# Patient Record
Sex: Female | Born: 1980 | Race: White | Hispanic: No | Smoking: Current some day smoker
Health system: Southern US, Community
[De-identification: ages and names within clinical notes are randomized; demographics above are authoritative.]

## PROBLEM LIST (undated history)

## (undated) DIAGNOSIS — O139 Gestational [pregnancy-induced] hypertension without significant proteinuria, unspecified trimester: Secondary | ICD-10-CM

## (undated) DIAGNOSIS — O24419 Gestational diabetes mellitus in pregnancy, unspecified control: Secondary | ICD-10-CM

## (undated) HISTORY — DX: Gestational diabetes mellitus in pregnancy, unspecified control: O24.419

## (undated) HISTORY — DX: Gestational (pregnancy-induced) hypertension without significant proteinuria, unspecified trimester: O13.9

---
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
## 2014-08-11 LAB — OB RESULTS CONSOLE RUBELLA ANTIBODY, IGM: Rubella: IMMUNE

## 2014-08-11 LAB — OB RESULTS CONSOLE ABO/RH: RH TYPE: POSITIVE

## 2014-08-11 LAB — OB RESULTS CONSOLE HEPATITIS B SURFACE ANTIGEN: Hepatitis B Surface Ag: NEGATIVE

## 2014-08-11 LAB — OB RESULTS CONSOLE ANTIBODY SCREEN: ANTIBODY SCREEN: NEGATIVE

## 2014-08-25 ENCOUNTER — Encounter: Payer: Self-pay | Admitting: Obstetrics and Gynecology

## 2014-08-25 ENCOUNTER — Ambulatory Visit (INDEPENDENT_AMBULATORY_CARE_PROVIDER_SITE_OTHER): Payer: PRIVATE HEALTH INSURANCE | Admitting: Obstetrics and Gynecology

## 2014-08-25 VITALS — BP 122/76 | HR 81 | Ht 66.0 in | Wt 325.6 lb

## 2014-08-25 DIAGNOSIS — Z3491 Encounter for supervision of normal pregnancy, unspecified, first trimester: Secondary | ICD-10-CM

## 2014-08-25 DIAGNOSIS — O99211 Obesity complicating pregnancy, first trimester: Secondary | ICD-10-CM

## 2014-08-25 DIAGNOSIS — O0990 Supervision of high risk pregnancy, unspecified, unspecified trimester: Secondary | ICD-10-CM | POA: Insufficient documentation

## 2014-08-25 DIAGNOSIS — O99210 Obesity complicating pregnancy, unspecified trimester: Secondary | ICD-10-CM

## 2014-08-25 DIAGNOSIS — Z3480 Encounter for supervision of other normal pregnancy, unspecified trimester: Secondary | ICD-10-CM

## 2014-08-25 DIAGNOSIS — O09299 Supervision of pregnancy with other poor reproductive or obstetric history, unspecified trimester: Secondary | ICD-10-CM

## 2014-08-25 DIAGNOSIS — E669 Obesity, unspecified: Secondary | ICD-10-CM

## 2014-08-25 DIAGNOSIS — Z6841 Body Mass Index (BMI) 40.0 and over, adult: Secondary | ICD-10-CM

## 2014-08-25 DIAGNOSIS — Z8632 Personal history of gestational diabetes: Secondary | ICD-10-CM

## 2014-08-25 DIAGNOSIS — Z8742 Personal history of other diseases of the female genital tract: Secondary | ICD-10-CM

## 2014-08-25 DIAGNOSIS — Z8759 Personal history of other complications of pregnancy, childbirth and the puerperium: Secondary | ICD-10-CM

## 2014-08-25 DIAGNOSIS — O09291 Supervision of pregnancy with other poor reproductive or obstetric history, first trimester: Secondary | ICD-10-CM

## 2014-08-25 NOTE — Progress Notes (Signed)
Had some dark brown blood at 7 weeks.  She did have pap smear done 3 weeks ago along with initial lab work.  She was dismissed from this practice due to her weight even though she had delivered twice before with them.

## 2014-08-26 DIAGNOSIS — Z6841 Body Mass Index (BMI) 40.0 and over, adult: Secondary | ICD-10-CM

## 2014-08-26 DIAGNOSIS — Z8759 Personal history of other complications of pregnancy, childbirth and the puerperium: Secondary | ICD-10-CM | POA: Insufficient documentation

## 2014-08-26 DIAGNOSIS — O99210 Obesity complicating pregnancy, unspecified trimester: Secondary | ICD-10-CM

## 2014-08-26 DIAGNOSIS — O09299 Supervision of pregnancy with other poor reproductive or obstetric history, unspecified trimester: Secondary | ICD-10-CM | POA: Insufficient documentation

## 2014-08-26 DIAGNOSIS — Z8632 Personal history of gestational diabetes: Secondary | ICD-10-CM

## 2014-08-26 MED ORDER — DOXYLAMINE-PYRIDOXINE 10-10 MG PO TBEC: DELAYED_RELEASE_TABLET | ORAL | Status: DC

## 2014-08-26 NOTE — Addendum Note (Signed)
Addended by: Catalina Antigua on: 08/26/2014 10:29 AM   Modules accepted: Orders

## 2014-08-26 NOTE — Progress Notes (Signed)
   Subjective:    Dawn Fitzpatrick is a [id number] [redacted]w[redacted]d being seen today for her first obstetrical visit.  Her obstetrical history is significant for obesity, pregnancy induced hypertension and gestational diabetes. Patient does intend to breast feed. Pregnancy history fully reviewed.  Patient reports nausea and vomiting.  Filed Vitals:   08/25/14 1042 08/25/14 1043  BP: 122/76   Pulse: 81   Height:   (1.676 m)  Weight: 325 lb 9.6 oz (147.691 kg)     HISTORY: OB History  Gravida Para Term Preterm AB SAB TAB Ectopic Multiple Living  0 0 0 0 0 0 2    # Outcome Date GA Lbr Len/2nd Weight Sex Delivery Anes PTL Lv  3 CUR           2 TRM 11/24/12 [redacted]w[redacted]d 05:00 10 lb 7 oz (4.734 kg) M SVD None N Y  1 TRM 07/14/10 [redacted]w[redacted]d 30:00 8 lb 4 oz (3.742 kg) F SVD EPI N Y     Past Medical History  Diagnosis Date  . Gestational diabetes     Diet controlled/  . PIH (pregnancy induced hypertension)    Past Surgical History  Procedure Laterality Date  . Tonsillectomy and adenoidectomy  1991   Family History  Problem Relation Age of Onset  . Heart disease Father   . Diabetes Paternal Uncle   . Heart disease Maternal Grandmother   . Stroke Maternal Grandmother   . Heart disease Maternal Grandfather   . Diabetes Paternal Grandmother   . Diabetes Paternal Uncle   . Diabetes Paternal Uncle   . Diabetes Paternal Uncle      Exam    Uterus:     Pelvic Exam: Not performed                              System:     Skin: normal coloration and turgor, no rashes    Neurologic: oriented, no focal deficits   Extremities: normal strength, tone, and muscle mass   HEENT extra ocular movement intact   Mouth/Teeth mucous membranes moist, pharynx normal without lesions and dental hygiene good   Neck supple and no masses   Cardiovascular: regular rate and rhythm   Respiratory:  chest clear, no wheezing, crepitations, rhonchi, normal symmetric air entry   Abdomen: soft, non tender, non  distended, obese   Urinary:       Assessment:    Pregnancy: [id number] Patient Active Problem List   Diagnosis Date Noted  . Morbid obesity with BMI of 50.0-59.9, adult 08/26/2014  . Maternal morbid obesity, antepartum 08/26/2014  . History of pregnancy induced hypertension 08/26/2014  . History of gestational diabetes in prior pregnancy, currently pregnant 08/26/2014  . Supervision of normal pregnancy 08/25/2014        Plan:     Initial labs drawn. Prenatal vitamins. Problem list reviewed and updated. Genetic Screening discussed First Screen: declined. Patient may consider quad screen if abnormal anatomy ultrasound  Ultrasound discussed; fetal survey: requested. Patient declined early GDM screen- She states that she is following a diabetic diet and is checking her CBGs. Patient will bring log at next visit.  Follow up in 4 weeks. 50% of 30 min visit spent on counseling and coordination of care.     Shivaun Bilello 08/26/2014

## 2014-09-24 ENCOUNTER — Ambulatory Visit (INDEPENDENT_AMBULATORY_CARE_PROVIDER_SITE_OTHER): Payer: PRIVATE HEALTH INSURANCE | Admitting: Obstetrics & Gynecology

## 2014-09-24 ENCOUNTER — Encounter: Payer: Self-pay | Admitting: Obstetrics & Gynecology

## 2014-09-24 VITALS — BP 125/75 | HR 77 | Wt 323.8 lb

## 2014-09-24 DIAGNOSIS — Z3492 Encounter for supervision of normal pregnancy, unspecified, second trimester: Secondary | ICD-10-CM

## 2014-09-24 NOTE — Progress Notes (Signed)
FHR present on U/S.

## 2014-09-24 NOTE — Progress Notes (Signed)
Routine visit. Anatomy u/s ordered for 19 weeks. Check hba1c. She reports that her fbs are 90s and 2 hours are less than 120. She will get her flu vaccine at her [place]

## 2014-09-25 LAB — HEMOGLOBIN A1C
ESTIMATED AVERAGE GLUCOSE: 126 mg/dL
Hgb A1c MFr Bld: 6 % — ABNORMAL HIGH (ref 4.8–5.6)

## 2014-10-21 ENCOUNTER — Encounter: Payer: Self-pay | Admitting: Obstetrics and Gynecology

## 2014-10-21 ENCOUNTER — Ambulatory Visit (INDEPENDENT_AMBULATORY_CARE_PROVIDER_SITE_OTHER): Payer: PRIVATE HEALTH INSURANCE | Admitting: Obstetrics and Gynecology

## 2014-10-21 VITALS — BP 155/80 | HR 88 | Wt 327.0 lb

## 2014-10-21 DIAGNOSIS — O09292 Supervision of pregnancy with other poor reproductive or obstetric history, second trimester: Secondary | ICD-10-CM

## 2014-10-21 DIAGNOSIS — Z8759 Personal history of other complications of pregnancy, childbirth and the puerperium: Secondary | ICD-10-CM

## 2014-10-21 DIAGNOSIS — Z3492 Encounter for supervision of normal pregnancy, unspecified, second trimester: Secondary | ICD-10-CM

## 2014-10-21 DIAGNOSIS — Z8632 Personal history of gestational diabetes: Secondary | ICD-10-CM

## 2014-10-21 DIAGNOSIS — O99212 Obesity complicating pregnancy, second trimester: Secondary | ICD-10-CM

## 2014-10-21 DIAGNOSIS — Z6841 Body Mass Index (BMI) 40.0 and over, adult: Secondary | ICD-10-CM

## 2014-10-21 NOTE — Progress Notes (Signed)
Patient is doing well without complaints. Anatomy ultrasound scheduled for 11/3. Patient did not bring log book or meter but reports fasting CBGs less than 100 mainly in 80's and pp in the 90's. Repeat BP with appropriate cuff was 111/72. Continue monitoring closely

## 2014-10-26 ENCOUNTER — Encounter: Payer: Self-pay | Admitting: Obstetrics and Gynecology

## 2014-10-26 ENCOUNTER — Ambulatory Visit (HOSPITAL_COMMUNITY)
Admission: RE | Admit: 2014-10-26 | Discharge: 2014-10-26 | Disposition: A | Discharge status: Home / Self Care | Payer: PRIVATE HEALTH INSURANCE | Source: Ambulatory Visit | Attending: Obstetrics & Gynecology | Admitting: Obstetrics & Gynecology

## 2014-10-26 ENCOUNTER — Other Ambulatory Visit (HOSPITAL_COMMUNITY): Payer: Self-pay | Admitting: Maternal and Fetal Medicine

## 2014-10-26 DIAGNOSIS — O99212 Obesity complicating pregnancy, second trimester: Secondary | ICD-10-CM | POA: Insufficient documentation

## 2014-10-26 DIAGNOSIS — Z6841 Body Mass Index (BMI) 40.0 and over, adult: Secondary | ICD-10-CM | POA: Diagnosis not present

## 2014-10-26 DIAGNOSIS — Z3A18 18 weeks gestation of pregnancy: Secondary | ICD-10-CM | POA: Insufficient documentation

## 2014-10-26 DIAGNOSIS — O09292 Supervision of pregnancy with other poor reproductive or obstetric history, second trimester: Secondary | ICD-10-CM | POA: Insufficient documentation

## 2014-10-26 DIAGNOSIS — Z1389 Encounter for screening for other disorder: Secondary | ICD-10-CM | POA: Insufficient documentation

## 2014-10-26 DIAGNOSIS — Z3492 Encounter for supervision of normal pregnancy, unspecified, second trimester: Secondary | ICD-10-CM

## 2014-10-26 DIAGNOSIS — O99210 Obesity complicating pregnancy, unspecified trimester: Secondary | ICD-10-CM | POA: Insufficient documentation

## 2014-11-17 ENCOUNTER — Encounter: Payer: Self-pay | Admitting: Family Medicine

## 2014-11-17 ENCOUNTER — Ambulatory Visit (INDEPENDENT_AMBULATORY_CARE_PROVIDER_SITE_OTHER): Payer: PRIVATE HEALTH INSURANCE | Admitting: Family Medicine

## 2014-11-17 VITALS — BP 127/80 | HR 93 | Wt 326.0 lb

## 2014-11-17 DIAGNOSIS — Z3492 Encounter for supervision of normal pregnancy, unspecified, second trimester: Secondary | ICD-10-CM

## 2014-11-17 NOTE — Progress Notes (Signed)
No meter or log book. Blood sugars reported within limits.  BP normal.  Discussed US - pt declined f/u US.

## 2014-12-14 ENCOUNTER — Encounter: Payer: Self-pay | Admitting: Family Medicine

## 2014-12-14 ENCOUNTER — Ambulatory Visit (INDEPENDENT_AMBULATORY_CARE_PROVIDER_SITE_OTHER): Payer: PRIVATE HEALTH INSURANCE | Admitting: Family Medicine

## 2014-12-14 VITALS — BP 137/83 | HR 89 | Wt 329.0 lb

## 2014-12-14 DIAGNOSIS — Z8759 Personal history of other complications of pregnancy, childbirth and the puerperium: Secondary | ICD-10-CM

## 2014-12-14 DIAGNOSIS — Z3492 Encounter for supervision of normal pregnancy, unspecified, second trimester: Secondary | ICD-10-CM

## 2014-12-14 NOTE — Progress Notes (Signed)
Reports BS are 140 at 1 hour. 2 hour pp are at 120 or less. Has h/o GDM and shoulder dystocia of 10 lb 7 oz baby, no sequelae To bring BS log. Needs f/u for anatomy and growth--scheduled in 2 wks.

## 2014-12-14 NOTE — Patient Instructions (Addendum)
Following an appropriate diet and keeping your blood sugar under control is the most important thing to do for your health and that of your unborn baby.  Please check your blood sugar 4 times daily.  Please keep accurate BS logs and bring them with you to every visit.  Please bring your meter also.  Goals for Blood sugar should be: 1. Fasting (first thing in the morning before eating) should be less than 90.   2.  2 hours after meals should be less than 120.  Please eat 3 meals and 3 snacks.  Include protein (meat, dairy-cheese, eggs, nuts) with all meals.  Be mindful that carbohydrates increase your blood sugar.  Not just sweet food (cookies, cake, donuts, fruit, juice, soda) but also bread, pasta, rice, and potatoes.  You have to limit how many carbs you are eating.  Adding exercise, as little as 30 minutes a day can decrease your blood sugar.  Gestational Diabetes Mellitus Gestational diabetes mellitus, often simply referred to as gestational diabetes, is a type of diabetes that some women develop during pregnancy. In gestational diabetes, the pancreas does not make enough insulin (a hormone), the cells are less responsive to the insulin that is made (insulin resistance), or both.Normally, insulin moves sugars from food into the tissue cells. The tissue cells use the sugars for energy. The lack of insulin or the lack of normal response to insulin causes excess sugars to build up in the blood instead of going into the tissue cells. As a result, high blood sugar (hyperglycemia) develops. The effect of high sugar (glucose) levels can cause many problems.  RISK FACTORS You have an increased chance of developing gestational diabetes if you have a family history of diabetes and also have one or more of the following risk factors:  A body mass index over 30 (obesity).  A previous pregnancy with gestational diabetes.  An older age at the time of pregnancy. If blood glucose levels are kept in  the normal range during pregnancy, women can have a healthy pregnancy. If your blood glucose levels are not well controlled, there may be risks to you, your unborn baby (fetus), your labor and delivery, or your newborn baby.  SYMPTOMS  If symptoms are experienced, they are much like symptoms you would normally expect during pregnancy. The symptoms of gestational diabetes include:   Increased thirst (polydipsia).  Increased urination (polyuria).  Increased urination during the night (nocturia).  Weight loss. This weight loss may be rapid.  Frequent, recurring infections.  Tiredness (fatigue).  Weakness.  Vision changes, such as blurred vision.  Fruity smell to your breath.  Abdominal pain. DIAGNOSIS Diabetes is diagnosed when blood glucose levels are increased. Your blood glucose level may be checked by one or more of the following blood tests:  A fasting blood glucose test. You will not be allowed to eat for at least 8 hours before a blood sample is taken.  A random blood glucose test. Your blood glucose is checked at any time of the day regardless of when you ate.  A hemoglobin A1c blood glucose test. A hemoglobin A1c test provides information about blood glucose control over the previous 3 months.  An oral glucose tolerance test (OGTT). Your blood glucose is measured after you have not eaten (fasted) for 1-3 hours and then after you drink a glucose-containing beverage. Since the hormones that cause insulin resistance are highest at about 24-28 weeks of a pregnancy, an OGTT is usually performed during that time. If   you have risk factors for gestational diabetes, your health care provider may test you for gestational diabetes earlier than 24 weeks of pregnancy. TREATMENT   You will need to take diabetes medicine or insulin daily to keep blood glucose levels in the desired range.  You will need to match insulin dosing with exercise and healthy food choices. The treatment goal is  to maintain the before-meal (preprandial), bedtime, and overnight blood glucose level at 60-99 mg/dL during pregnancy. The treatment goal is to further maintain peak after-meal blood sugar (postprandial glucose) level at 100-140 mg/dL. HOME CARE INSTRUCTIONS   Have your hemoglobin A1c level checked twice a year.  Perform daily blood glucose monitoring as directed by your health care provider. It is common to perform frequent blood glucose monitoring.  Monitor urine ketones when you are ill and as directed by your health care provider.  Take your diabetes medicine and insulin as directed by your health care provider to maintain your blood glucose level in the desired range.  Never run out of diabetes medicine or insulin. It is needed every day.  Adjust insulin based on your intake of carbohydrates. Carbohydrates can raise blood glucose levels but need to be included in your diet. Carbohydrates provide vitamins, minerals, and fiber which are an essential part of a healthy diet. Carbohydrates are found in fruits, vegetables, whole grains, dairy products, legumes, and foods containing added sugars.  Eat healthy foods. Alternate 3 meals with 3 snacks.  Maintain a healthy weight gain. The usual total expected weight gain varies according to your prepregnancy body mass index (BMI).  Carry a medical alert card or wear your medical alert jewelry.  Carry a 15-gram carbohydrate snack with you at all times to treat low blood glucose (hypoglycemia). Some examples of 15-gram carbohydrate snacks include:  Glucose tablets, 3 or 4.  Glucose gel, 15-gram tube.  Raisins, 2 tablespoons (24 g).  Jelly beans, 6.  Animal crackers, 8.  Fruit juice, regular soda, or low-fat milk, 4 ounces (120 mL).  Gummy treats, 9.  Recognize hypoglycemia. Hypoglycemia during pregnancy occurs with blood glucose levels of 60 mg/dL and below. The risk for hypoglycemia increases when fasting or skipping meals, during or  after intense exercise, and during sleep. Hypoglycemia symptoms can include:  Tremors or shakes.  Decreased ability to concentrate.  Sweating.  Increased heart rate.  Headache.  Dry mouth.  Hunger.  Irritability.  Anxiety.  Restless sleep.  Altered speech or coordination.  Confusion.  Treat hypoglycemia promptly. If you are alert and able to safely swallow, follow the 15:15 rule:  Take 15-20 grams of rapid-acting glucose or carbohydrate. Rapid-acting options include glucose gel, glucose tablets, or 4 ounces (120 mL) of fruit juice, regular soda, or low-fat milk.  Check your blood glucose level 15 minutes after taking the glucose.  Take 15-20 grams more of glucose if the repeat blood glucose level is still 70 mg/dL or below.  Eat a meal or snack within 1 hour once blood glucose levels return to normal.  Be alert to polyuria (excess urination) and polydipsia (excess thirst) which are early signs of hyperglycemia. An early awareness of hyperglycemia allows for prompt treatment. Treat hyperglycemia as directed by your health care provider.  Engage in at least 30 minutes of physical activity a day or as directed by your health care provider. Ten minutes of physical activity timed 30 minutes after each meal is encouraged to control postprandial blood glucose levels.  Adjust your insulin dosing and food intake as   needed if you start a new exercise or sport.  Follow your sick-day plan at any time you are unable to eat or drink as usual.  Avoid tobacco and alcohol use.  Keep all follow-up visits as directed by your health care provider.  Follow the advice of your health care provider regarding your prenatal and post-delivery (postpartum) appointments, meal planning, exercise, medicines, vitamins, blood tests, other medical tests, and physical activities.  Perform daily skin and foot care. Examine your skin and feet daily for cuts, bruises, redness, nail problems, bleeding,  blisters, or sores.  Brush your teeth and gums at least twice a day and floss at least once a day. Follow up with your dentist regularly.  Schedule an eye exam during the first trimester of your pregnancy or as directed by your health care provider.  Share your diabetes management plan with your workplace or school.  Stay up-to-date with immunizations.  Learn to manage stress.  Obtain ongoing diabetes education and support as needed.  Learn about and consider breastfeeding your baby.  You should have your blood sugar level checked 6-12 weeks after delivery. This is done with an oral glucose tolerance test (OGTT). SEEK MEDICAL CARE IF:   You are unable to eat food or drink fluids for more than 6 hours.  You have nausea and vomiting for more than 6 hours.  You have a blood glucose level of 200 mg/dL and you have ketones in your urine.  There is a change in mental status.  You develop vision problems.  You have a persistent headache.  You have upper abdominal pain or discomfort.  You develop an additional serious illness.  You have diarrhea for more than 6 hours.  You have been sick or have had a fever for a couple of days and are not getting better. SEEK IMMEDIATE MEDICAL CARE IF:   You have difficulty breathing.  You no longer feel the baby moving.  You are bleeding or have discharge from your vagina.  You start having premature contractions or labor. MAKE SURE YOU:  Understand these instructions.  Will watch your condition.  Will get help right away if you are not doing well or get worse. Document Released: 03/18/2001 Document Revised: 04/26/2014 Document Reviewed: 07/08/2012 [employer] Patient Information 2015 [employer]. This information is not intended to replace advice given to you by your health care provider. Make sure you discuss any questions you have with your health care provider.  Second Trimester of Pregnancy The second trimester is from week  13 through week 28, months 4 through 6. The second trimester is often a time when you feel your best. Your body has also adjusted to being pregnant, and you begin to feel better physically. Usually, morning sickness has lessened or quit completely, you may have more energy, and you may have an increase in appetite. The second trimester is also a time when the fetus is growing rapidly. At the end of the sixth month, the fetus is about 9 inches long and weighs about 1 pounds. You will likely begin to feel the baby move (quickening) between 18 and 20 weeks of the pregnancy. BODY CHANGES Your body goes through many changes during pregnancy. The changes vary from woman to woman.   Your weight will continue to increase. You will notice your lower abdomen bulging out.  You may begin to get stretch marks on your hips, abdomen, and breasts.  You may develop headaches that can be relieved by medicines approved by your   health care provider.  You may urinate more often because the fetus is pressing on your bladder.  You may develop or continue to have heartburn as a result of your pregnancy.  You may develop constipation because certain hormones are causing the muscles that push waste through your intestines to slow down.  You may develop hemorrhoids or swollen, bulging veins (varicose veins).  You may have back pain because of the weight gain and pregnancy hormones relaxing your joints between the bones in your pelvis and as a result of a shift in weight and the muscles that support your balance.  Your breasts will continue to grow and be tender.  Your gums may bleed and may be sensitive to brushing and flossing.  Dark spots or blotches (chloasma, mask of pregnancy) may develop on your face. This will likely fade after the baby is born.  A dark line from your belly button to the pubic area (linea nigra) may appear. This will likely fade after the baby is born.  You may have changes in your hair.  These can include thickening of your hair, rapid growth, and changes in texture. Some women also have hair loss during or after pregnancy, or hair that feels dry or thin. Your hair will most likely return to normal after your baby is born. WHAT TO EXPECT AT YOUR PRENATAL VISITS During a routine prenatal visit:  You will be weighed to make sure you and the fetus are growing normally.  Your blood pressure will be taken.  Your abdomen will be measured to track your baby's growth.  The fetal heartbeat will be listened to.  Any test results from the previous visit will be discussed. Your health care provider may ask you:  How you are feeling.  If you are feeling the baby move.  If you have had any abnormal symptoms, such as leaking fluid, bleeding, severe headaches, or abdominal cramping.  If you have any questions. Other tests that may be performed during your second trimester include:  Blood tests that check for:  Low iron levels (anemia).  Gestational diabetes (between 24 and 28 weeks).  Rh antibodies.  Urine tests to check for infections, diabetes, or protein in the urine.  An ultrasound to confirm the proper growth and development of the baby.  An amniocentesis to check for possible genetic problems.  Fetal screens for spina bifida and Down syndrome. HOME CARE INSTRUCTIONS   Avoid all smoking, herbs, alcohol, and unprescribed drugs. These chemicals affect the formation and growth of the baby.  Follow your health care provider's instructions regarding medicine use. There are medicines that are either safe or unsafe to take during pregnancy.  Exercise only as directed by your health care provider. Experiencing uterine cramps is a good sign to stop exercising.  Continue to eat regular, healthy meals.  Wear a good support bra for breast tenderness.  Do not use hot tubs, steam rooms, or saunas.  Wear your seat belt at all times when driving.  Avoid raw meat, uncooked  cheese, cat litter boxes, and soil used by cats. These carry germs that can cause birth defects in the baby.  Take your prenatal vitamins.  Try taking a stool softener (if your health care provider approves) if you develop constipation. Eat more high-fiber foods, such as fresh vegetables or fruit and whole grains. Drink plenty of fluids to keep your urine clear or pale yellow.  Take warm sitz baths to soothe any pain or discomfort caused by hemorrhoids. Use   hemorrhoid cream if your health care provider approves.  If you develop varicose veins, wear support hose. Elevate your feet for 15 minutes, 3-4 times a day. Limit salt in your diet.  Avoid heavy lifting, wear low heel shoes, and practice good posture.  Rest with your legs elevated if you have leg cramps or low back pain.  Visit your dentist if you have not gone yet during your pregnancy. Use a soft toothbrush to brush your teeth and be gentle when you floss.  A sexual relationship may be continued unless your health care provider directs you otherwise.  Continue to go to all your prenatal visits as directed by your health care provider. SEEK MEDICAL CARE IF:   You have dizziness.  You have mild pelvic cramps, pelvic pressure, or nagging pain in the abdominal area.  You have persistent nausea, vomiting, or diarrhea.  You have a bad smelling vaginal discharge.  You have pain with urination. SEEK IMMEDIATE MEDICAL CARE IF:   You have a fever.  You are leaking fluid from your vagina.  You have spotting or bleeding from your vagina.  You have severe abdominal cramping or pain.  You have rapid weight gain or loss.  You have shortness of breath with chest pain.  You notice sudden or extreme swelling of your face, hands, ankles, feet, or legs.  You have not felt your baby move in over an hour.  You have severe headaches that do not go away with medicine.  You have vision changes. Document Released: 12/04/2001 Document  Revised: 12/15/2013 Document Reviewed: 02/10/2013 [employer] Patient Information 2015 [employer]. This information is not intended to replace advice given to you by your health care provider. Make sure you discuss any questions you have with your health care provider.  Breastfeeding Deciding to breastfeed is one of the best choices you can make for you and your baby. A change in hormones during pregnancy causes your breast tissue to grow and increases the number and size of your milk ducts. These hormones also allow proteins, sugars, and fats from your blood supply to make breast milk in your milk-producing glands. Hormones prevent breast milk from being released before your baby is born as well as prompt milk flow after birth. Once breastfeeding has begun, thoughts of your baby, as well as his or her sucking or crying, can stimulate the release of milk from your milk-producing glands.  BENEFITS OF BREASTFEEDING For Your Baby  Your first milk (colostrum) helps your baby's digestive system function better.   There are antibodies in your milk that help your baby fight off infections.   Your baby has a lower incidence of asthma, allergies, and sudden infant death syndrome.   The nutrients in breast milk are better for your baby than infant formulas and are designed uniquely for your baby's needs.   Breast milk improves your baby's brain development.   Your baby is less likely to develop other conditions, such as childhood obesity, asthma, or type 2 diabetes mellitus.  For You   Breastfeeding helps to create a very special bond between you and your baby.   Breastfeeding is convenient. Breast milk is always available at the correct temperature and costs nothing.   Breastfeeding helps to burn calories and helps you lose the weight gained during pregnancy.   Breastfeeding makes your uterus contract to its prepregnancy size faster and slows bleeding (lochia) after you give birth.    Breastfeeding helps to lower your risk of developing type 2   diabetes mellitus, osteoporosis, and breast or ovarian cancer later in life. SIGNS THAT YOUR BABY IS HUNGRY Early Signs of Hunger  Increased alertness or activity.  Stretching.  Movement of the head from side to side.  Movement of the head and opening of the mouth when the corner of the mouth or cheek is stroked (rooting).  Increased sucking sounds, smacking lips, cooing, sighing, or squeaking.  Hand-to-mouth movements.  Increased sucking of fingers or hands. Late Signs of Hunger  Fussing.  Intermittent crying. Extreme Signs of Hunger Signs of extreme hunger will require calming and consoling before your baby will be able to breastfeed successfully. Do not wait for the following signs of extreme hunger to occur before you initiate breastfeeding:   Restlessness.  A loud, strong cry.   Screaming. BREASTFEEDING BASICS Breastfeeding Initiation  Find a comfortable place to sit or lie down, with your neck and back well supported.  Place a pillow or rolled up blanket under your baby to bring him or her to the level of your breast (if you are seated). Nursing pillows are specially designed to help support your arms and your baby while you breastfeed.  Make sure that your baby's abdomen is facing your abdomen.   Gently massage your breast. With your fingertips, massage from your chest wall toward your nipple in a circular motion. This encourages milk flow. You may need to continue this action during the feeding if your milk flows slowly.  Support your breast with 4 fingers underneath and your thumb above your nipple. Make sure your fingers are well away from your nipple and your baby's mouth.   Stroke your baby's lips gently with your finger or nipple.   When your baby's mouth is open wide enough, quickly bring your baby to your breast, placing your entire nipple and as much of the colored area around your  nipple (areola) as possible into your baby's mouth.   More areola should be visible above your baby's upper lip than below the lower lip.   Your baby's tongue should be between his or her lower gum and your breast.   Ensure that your baby's mouth is correctly positioned around your nipple (latched). Your baby's lips should create a seal on your breast and be turned out (everted).  It is common for your baby to suck about 2-3 minutes in order to start the flow of breast milk. Latching Teaching your baby how to latch on to your breast properly is very important. An improper latch can cause nipple pain and decreased milk supply for you and poor weight gain in your baby. Also, if your baby is not latched onto your nipple properly, he or she may swallow some air during feeding. This can make your baby fussy. Burping your baby when you switch breasts during the feeding can help to get rid of the air. However, teaching your baby to latch on properly is still the best way to prevent fussiness from swallowing air while breastfeeding. Signs that your baby has successfully latched on to your nipple:    Silent tugging or silent sucking, without causing you pain.   Swallowing heard between every 3-4 sucks.    Muscle movement above and in front of his or her ears while sucking.  Signs that your baby has not successfully latched on to nipple:   Sucking sounds or smacking sounds from your baby while breastfeeding.  Nipple pain. If you think your baby has not latched on correctly, slip your finger into   the corner of your baby's mouth to break the suction and place it between your baby's gums. Attempt breastfeeding initiation again. Signs of Successful Breastfeeding Signs from your baby:   A gradual decrease in the number of sucks or complete cessation of sucking.   Falling asleep.   Relaxation of his or her body.   Retention of a small amount of milk in his or her mouth.   Letting go of  your breast by himself or herself. Signs from you:  Breasts that have increased in firmness, weight, and size 1-3 hours after feeding.   Breasts that are softer immediately after breastfeeding.  Increased milk volume, as well as a change in milk consistency and color by the fifth day of breastfeeding.   Nipples that are not sore, cracked, or bleeding. Signs That Your Baby is Getting Enough Milk  Wetting at least 3 diapers in a 24-hour period. The urine should be clear and pale yellow by age 5 days.  At least 3 stools in a 24-hour period by age 5 days. The stool should be soft and yellow.  At least 3 stools in a 24-hour period by age 7 days. The stool should be seedy and yellow.  No loss of weight greater than 10% of birth weight during the first 3 days of age.  Average weight gain of 4-7 ounces (113-198 g) per week after age 4 days.  Consistent daily weight gain by age 5 days, without weight loss after the age of 2 weeks. After a feeding, your baby may spit up a small amount. This is common. BREASTFEEDING FREQUENCY AND DURATION Frequent feeding will help you make more milk and can prevent sore nipples and breast engorgement. Breastfeed when you feel the need to reduce the fullness of your breasts or when your baby shows signs of hunger. This is called "breastfeeding on demand." Avoid introducing a pacifier to your baby while you are working to establish breastfeeding (the first 4-6 weeks after your baby is born). After this time you may choose to use a pacifier. Research has shown that pacifier use during the first year of a baby's life decreases the risk of sudden infant death syndrome (SIDS). Allow your baby to feed on each breast as long as he or she wants. Breastfeed until your baby is finished feeding. When your baby unlatches or falls asleep while feeding from the first breast, offer the second breast. Because newborns are often sleepy in the first few weeks of life, you may need to  awaken your baby to get him or her to feed. Breastfeeding times will vary from baby to baby. However, the following rules can serve as a guide to help you ensure that your baby is properly fed:  Newborns (babies 4 weeks of age or younger) may breastfeed every 1-3 hours.  Newborns should not go longer than 3 hours during the day or 5 hours during the night without breastfeeding.  You should breastfeed your baby a minimum of 8 times in a 24-hour period until you begin to introduce solid foods to your baby at around 6 months of age. BREAST MILK PUMPING Pumping and storing breast milk allows you to ensure that your baby is exclusively fed your breast milk, even at times when you are unable to breastfeed. This is especially important if you are going back to work while you are still breastfeeding or when you are not able to be present during feedings. Your lactation consultant can give you guidelines on how   long it is safe to store breast milk.  A breast pump is a machine that allows you to pump milk from your breast into a sterile bottle. The pumped breast milk can then be stored in a refrigerator or freezer. Some breast pumps are operated by hand, while others use electricity. Ask your lactation consultant which type will work best for you. Breast pumps can be purchased, but some hospitals and breastfeeding support groups lease breast pumps on a monthly basis. A lactation consultant can teach you how to hand express breast milk, if you prefer not to use a pump.  CARING FOR YOUR BREASTS WHILE YOU BREASTFEED Nipples can become dry, cracked, and sore while breastfeeding. The following recommendations can help keep your breasts moisturized and healthy:  Avoid using soap on your nipples.   Wear a supportive bra. Although not required, special nursing bras and tank tops are designed to allow access to your breasts for breastfeeding without taking off your entire bra or top. Avoid wearing underwire-style bras  or extremely tight bras.  Air dry your nipples for 3-4minutes after each feeding.   Use only cotton bra pads to absorb leaked breast milk. Leaking of breast milk between feedings is normal.   Use lanolin on your nipples after breastfeeding. Lanolin helps to maintain your skin's normal moisture barrier. If you use pure lanolin, you do not need to wash it off before feeding your baby again. Pure lanolin is not toxic to your baby. You may also hand express a few drops of breast milk and gently massage that milk into your nipples and allow the milk to air dry. In the first few weeks after giving birth, some women experience extremely full breasts (engorgement). Engorgement can make your breasts feel heavy, warm, and tender to the touch. Engorgement peaks within 3-5 days after you give birth. The following recommendations can help ease engorgement:  Completely empty your breasts while breastfeeding or pumping. You may want to start by applying warm, moist heat (in the shower or with warm water-soaked hand towels) just before feeding or pumping. This increases circulation and helps the milk flow. If your baby does not completely empty your breasts while breastfeeding, pump any extra milk after he or she is finished.  Wear a snug bra (nursing or regular) or tank top for 1-2 days to signal your body to slightly decrease milk production.  Apply ice packs to your breasts, unless this is too uncomfortable for you.  Make sure that your baby is latched on and positioned properly while breastfeeding. If engorgement persists after 48 hours of following these recommendations, contact your health care provider or a lactation consultant. OVERALL HEALTH CARE RECOMMENDATIONS WHILE BREASTFEEDING  Eat healthy foods. Alternate between meals and snacks, eating 3 of each per day. Because what you eat affects your breast milk, some of the foods may make your baby more irritable than usual. Avoid eating these foods if  you are sure that they are negatively affecting your baby.  Drink milk, fruit juice, and water to satisfy your thirst (about 10 glasses a day).   Rest often, relax, and continue to take your prenatal vitamins to prevent fatigue, stress, and anemia.  Continue breast self-awareness checks.  Avoid chewing and smoking tobacco.  Avoid alcohol and drug use. Some medicines that may be harmful to your baby can pass through breast milk. It is important to ask your health care provider before taking any medicine, including all over-the-counter and prescription medicine as well as vitamin   and herbal supplements. It is possible to become pregnant while breastfeeding. If birth control is desired, ask your health care provider about options that will be safe for your baby. SEEK MEDICAL CARE IF:   You feel like you want to stop breastfeeding or have become frustrated with breastfeeding.  You have painful breasts or nipples.  Your nipples are cracked or bleeding.  Your breasts are red, tender, or warm.  You have a swollen area on either breast.  You have a fever or chills.  You have nausea or vomiting.  You have drainage other than breast milk from your nipples.  Your breasts do not become full before feedings by the fifth day after you give birth.  You feel sad and depressed.  Your baby is too sleepy to eat well.  Your baby is having trouble sleeping.   Your baby is wetting less than 3 diapers in a 24-hour period.  Your baby has less than 3 stools in a 24-hour period.  Your baby's skin or the white part of his or her eyes becomes yellow.   Your baby is not gaining weight by 5 days of age. SEEK IMMEDIATE MEDICAL CARE IF:   Your baby is overly tired (lethargic) and does not want to wake up and feed.  Your baby develops an unexplained fever. Document Released: 12/10/2005 Document Revised: 12/15/2013 Document Reviewed: 06/03/2013 [employer] Patient Information 2015 [employer].  This information is not intended to replace advice given to you by your health care provider. Make sure you discuss any questions you have with your health care provider.  

## 2014-12-24 NOTE — L&D Delivery Note (Signed)
Delivery Note At 4:32 AM a viable female was delivered via Vaginal, Spontaneous Delivery (Presentation: LOA ).  APGAR: 6, 9; weight 9 lb 12.3 oz (4430 g).  No difficulty with delivery of shoulders; loose nuchal cord x 1 easily reduced prior to delivery. Infant with initial cry and and heart rate >100, but then seemed to be having difficulty with transition. Cord clamped and cut by FOB and infant taken to warmer and awaiting staff for eval. Placenta status: Intact, Spontaneous.  Cord: 3 vessels   Anesthesia: Epidural  Episiotomy: None Lacerations: None Est. Blood Loss (mL):  200  Mom to postpartum.  Baby to Couplet care / Skin to Skin.  Cam HaiSHAW, KIMBERLY CNM 03/19/2015, 5:08 AM

## 2014-12-28 ENCOUNTER — Ambulatory Visit (INDEPENDENT_AMBULATORY_CARE_PROVIDER_SITE_OTHER): Payer: PRIVATE HEALTH INSURANCE | Admitting: Family Medicine

## 2014-12-28 ENCOUNTER — Ambulatory Visit (HOSPITAL_COMMUNITY)
Admission: RE | Admit: 2014-12-28 | Discharge: 2014-12-28 | Disposition: A | Discharge status: Home / Self Care | Payer: PRIVATE HEALTH INSURANCE | Source: Ambulatory Visit | Attending: Family Medicine | Admitting: Family Medicine

## 2014-12-28 VITALS — BP 115/85 | HR 105 | Wt 331.0 lb

## 2014-12-28 DIAGNOSIS — O99212 Obesity complicating pregnancy, second trimester: Secondary | ICD-10-CM | POA: Diagnosis not present

## 2014-12-28 DIAGNOSIS — Z36 Encounter for antenatal screening of mother: Secondary | ICD-10-CM | POA: Diagnosis present

## 2014-12-28 DIAGNOSIS — O09292 Supervision of pregnancy with other poor reproductive or obstetric history, second trimester: Secondary | ICD-10-CM

## 2014-12-28 DIAGNOSIS — Z3A27 27 weeks gestation of pregnancy: Secondary | ICD-10-CM | POA: Insufficient documentation

## 2014-12-28 DIAGNOSIS — Z23 Encounter for immunization: Secondary | ICD-10-CM

## 2014-12-28 DIAGNOSIS — O99210 Obesity complicating pregnancy, unspecified trimester: Secondary | ICD-10-CM | POA: Insufficient documentation

## 2014-12-28 DIAGNOSIS — Z8632 Personal history of gestational diabetes: Secondary | ICD-10-CM

## 2014-12-28 DIAGNOSIS — Z3492 Encounter for supervision of normal pregnancy, unspecified, second trimester: Secondary | ICD-10-CM

## 2014-12-28 LAB — OB RESULTS CONSOLE RPR: RPR: NONREACTIVE

## 2014-12-28 LAB — OB RESULTS CONSOLE HIV ANTIBODY (ROUTINE TESTING): HIV: NONREACTIVE

## 2014-12-28 NOTE — Addendum Note (Signed)
Addended by: Barbara CowerNOGUES, Nathanie Ottley L on: 12/28/2014 04:41 PM   Modules accepted: Orders

## 2014-12-28 NOTE — Patient Instructions (Signed)
Gestational Diabetes Mellitus Gestational diabetes mellitus, often simply referred to as gestational diabetes, is a type of diabetes that some women develop during pregnancy. In gestational diabetes, the pancreas does not make enough insulin (a hormone), the cells are less responsive to the insulin that is made (insulin resistance), or both.Normally, insulin moves sugars from food into the tissue cells. The tissue cells use the sugars for energy. The lack of insulin or the lack of normal response to insulin causes excess sugars to build up in the blood instead of going into the tissue cells. As a result, high blood sugar (hyperglycemia) develops. The effect of high sugar (glucose) levels can cause many problems.  RISK FACTORS You have an increased chance of developing gestational diabetes if you have a family history of diabetes and also have one or more of the following risk factors:  A body mass index over 30 (obesity).  A previous pregnancy with gestational diabetes.  An older age at the time of pregnancy. If blood glucose levels are kept in the normal range during pregnancy, women can have a healthy pregnancy. If your blood glucose levels are not well controlled, there may be risks to you, your unborn baby (fetus), your labor and delivery, or your newborn baby.  SYMPTOMS  If symptoms are experienced, they are much like symptoms you would normally expect during pregnancy. The symptoms of gestational diabetes include:   Increased thirst (polydipsia).  Increased urination (polyuria).  Increased urination during the night (nocturia).  Weight loss. This weight loss may be rapid.  Frequent, recurring infections.  Tiredness (fatigue).  Weakness.  Vision changes, such as blurred vision.  Fruity smell to your breath.  Abdominal pain. DIAGNOSIS Diabetes is diagnosed when blood glucose levels are increased. Your blood glucose level may be checked by one or more of the following blood  tests:  A fasting blood glucose test. You will not be allowed to eat for at least 8 hours before a blood sample is taken.  A random blood glucose test. Your blood glucose is checked at any time of the day regardless of when you ate.  A hemoglobin A1c blood glucose test. A hemoglobin A1c test provides information about blood glucose control over the previous 3 months.  An oral glucose tolerance test (OGTT). Your blood glucose is measured after you have not eaten (fasted) for 1-3 hours and then after you drink a glucose-containing beverage. Since the hormones that cause insulin resistance are highest at about 24-28 weeks of a pregnancy, an OGTT is usually performed during that time. If you have risk factors for gestational diabetes, your health care provider may test you for gestational diabetes earlier than 24 weeks of pregnancy. TREATMENT   You will need to take diabetes medicine or insulin daily to keep blood glucose levels in the desired range.  You will need to match insulin dosing with exercise and healthy food choices. The treatment goal is to maintain the before-meal (preprandial), bedtime, and overnight blood glucose level at 60-99 mg/dL during pregnancy. The treatment goal is to further maintain peak after-meal blood sugar (postprandial glucose) level at 100-140 mg/dL. HOME CARE INSTRUCTIONS   Have your hemoglobin A1c level checked twice a year.  Perform daily blood glucose monitoring as directed by your health care provider. It is common to perform frequent blood glucose monitoring.  Monitor urine ketones when you are ill and as directed by your health care provider.  Take your diabetes medicine and insulin as directed by your health care provider   to maintain your blood glucose level in the desired range.  Never run out of diabetes medicine or insulin. It is needed every day.  Adjust insulin based on your intake of carbohydrates. Carbohydrates can raise blood glucose levels but  need to be included in your diet. Carbohydrates provide vitamins, minerals, and fiber which are an essential part of a healthy diet. Carbohydrates are found in fruits, vegetables, whole grains, dairy products, legumes, and foods containing added sugars.  Eat healthy foods. Alternate 3 meals with 3 snacks.  Maintain a healthy weight gain. The usual total expected weight gain varies according to your prepregnancy body mass index (BMI).  Carry a medical alert card or wear your medical alert jewelry.  Carry a 15-gram carbohydrate snack with you at all times to treat low blood glucose (hypoglycemia). Some examples of 15-gram carbohydrate snacks include:  Glucose tablets, 3 or 4.  Glucose gel, 15-gram tube.  Raisins, 2 tablespoons (24 g).  Jelly beans, 6.  Animal crackers, 8.  Fruit juice, regular soda, or low-fat milk, 4 ounces (120 mL).  Gummy treats, 9.  Recognize hypoglycemia. Hypoglycemia during pregnancy occurs with blood glucose levels of 60 mg/dL and below. The risk for hypoglycemia increases when fasting or skipping meals, during or after intense exercise, and during sleep. Hypoglycemia symptoms can include:  Tremors or shakes.  Decreased ability to concentrate.  Sweating.  Increased heart rate.  Headache.  Dry mouth.  Hunger.  Irritability.  Anxiety.  Restless sleep.  Altered speech or coordination.  Confusion.  Treat hypoglycemia promptly. If you are alert and able to safely swallow, follow the 15:15 rule:  Take 15-20 grams of rapid-acting glucose or carbohydrate. Rapid-acting options include glucose gel, glucose tablets, or 4 ounces (120 mL) of fruit juice, regular soda, or low-fat milk.  Check your blood glucose level 15 minutes after taking the glucose.  Take 15-20 grams more of glucose if the repeat blood glucose level is still 70 mg/dL or below.  Eat a meal or snack within 1 hour once blood glucose levels return to normal.  Be alert to polyuria  (excess urination) and polydipsia (excess thirst) which are early signs of hyperglycemia. An early awareness of hyperglycemia allows for prompt treatment. Treat hyperglycemia as directed by your health care provider.  Engage in at least 30 minutes of physical activity a day or as directed by your health care provider. Ten minutes of physical activity timed 30 minutes after each meal is encouraged to control postprandial blood glucose levels.  Adjust your insulin dosing and food intake as needed if you start a new exercise or sport.  Follow your sick-day plan at any time you are unable to eat or drink as usual.  Avoid tobacco and alcohol use.  Keep all follow-up visits as directed by your health care provider.  Follow the advice of your health care provider regarding your prenatal and post-delivery (postpartum) appointments, meal planning, exercise, medicines, vitamins, blood tests, other medical tests, and physical activities.  Perform daily skin and foot care. Examine your skin and feet daily for cuts, bruises, redness, nail problems, bleeding, blisters, or sores.  Brush your teeth and gums at least twice a day and floss at least once a day. Follow up with your dentist regularly.  Schedule an eye exam during the first trimester of your pregnancy or as directed by your health care provider.  Share your diabetes management plan with your workplace or school.  Stay up-to-date with immunizations.  Learn to manage stress.    Obtain ongoing diabetes education and support as needed.  Learn about and consider breastfeeding your baby.  You should have your blood sugar level checked 6-12 weeks after delivery. This is done with an oral glucose tolerance test (OGTT). SEEK MEDICAL CARE IF:   You are unable to eat food or drink fluids for more than 6 hours.  You have nausea and vomiting for more than 6 hours.  You have a blood glucose level of 200 mg/dL and you have ketones in your  urine.  There is a change in mental status.  You develop vision problems.  You have a persistent headache.  You have upper abdominal pain or discomfort.  You develop an additional serious illness.  You have diarrhea for more than 6 hours.  You have been sick or have had a fever for a couple of days and are not getting better. SEEK IMMEDIATE MEDICAL CARE IF:   You have difficulty breathing.  You no longer feel the baby moving.  You are bleeding or have discharge from your vagina.  You start having premature contractions or labor. MAKE SURE YOU:  Understand these instructions.  Will watch your condition.  Will get help right away if you are not doing well or get worse. Document Released: 03/18/2001 Document Revised: 04/26/2014 Document Reviewed: 07/08/2012 [employer] Patient Information 2015 [employer]. This information is not intended to replace advice given to you by your health care provider. Make sure you discuss any questions you have with your health care provider.  Second Trimester of Pregnancy The second trimester is from week 13 through week 28, months 4 through 6. The second trimester is often a time when you feel your best. Your body has also adjusted to being pregnant, and you begin to feel better physically. Usually, morning sickness has lessened or quit completely, you may have more energy, and you may have an increase in appetite. The second trimester is also a time when the fetus is growing rapidly. At the end of the sixth month, the fetus is about 9 inches long and weighs about 1 pounds. You will likely begin to feel the baby move (quickening) between 18 and 20 weeks of the pregnancy. BODY CHANGES Your body goes through many changes during pregnancy. The changes vary from woman to woman.   Your weight will continue to increase. You will notice your lower abdomen bulging out.  You may begin to get stretch marks on your hips, abdomen, and breasts.  You may  develop headaches that can be relieved by medicines approved by your health care provider.  You may urinate more often because the fetus is pressing on your bladder.  You may develop or continue to have heartburn as a result of your pregnancy.  You may develop constipation because certain hormones are causing the muscles that push waste through your intestines to slow down.  You may develop hemorrhoids or swollen, bulging veins (varicose veins).  You may have back pain because of the weight gain and pregnancy hormones relaxing your joints between the bones in your pelvis and as a result of a shift in weight and the muscles that support your balance.  Your breasts will continue to grow and be tender.  Your gums may bleed and may be sensitive to brushing and flossing.  Dark spots or blotches (chloasma, mask of pregnancy) may develop on your face. This will likely fade after the baby is born.  A dark line from your belly button to the pubic area (linea   nigra) may appear. This will likely fade after the baby is born.  You may have changes in your hair. These can include thickening of your hair, rapid growth, and changes in texture. Some women also have hair loss during or after pregnancy, or hair that feels dry or thin. Your hair will most likely return to normal after your baby is born. WHAT TO EXPECT AT YOUR PRENATAL VISITS During a routine prenatal visit:  You will be weighed to make sure you and the fetus are growing normally.  Your blood pressure will be taken.  Your abdomen will be measured to track your baby's growth.  The fetal heartbeat will be listened to.  Any test results from the previous visit will be discussed. Your health care provider may ask you:  How you are feeling.  If you are feeling the baby move.  If you have had any abnormal symptoms, such as leaking fluid, bleeding, severe headaches, or abdominal cramping.  If you have any questions. Other tests that  may be performed during your second trimester include:  Blood tests that check for:  Low iron levels (anemia).  Gestational diabetes (between 24 and 28 weeks).  Rh antibodies.  Urine tests to check for infections, diabetes, or protein in the urine.  An ultrasound to confirm the proper growth and development of the baby.  An amniocentesis to check for possible genetic problems.  Fetal screens for spina bifida and Down syndrome. HOME CARE INSTRUCTIONS   Avoid all smoking, herbs, alcohol, and unprescribed drugs. These chemicals affect the formation and growth of the baby.  Follow your health care provider's instructions regarding medicine use. There are medicines that are either safe or unsafe to take during pregnancy.  Exercise only as directed by your health care provider. Experiencing uterine cramps is a good sign to stop exercising.  Continue to eat regular, healthy meals.  Wear a good support bra for breast tenderness.  Do not use hot tubs, steam rooms, or saunas.  Wear your seat belt at all times when driving.  Avoid raw meat, uncooked cheese, cat litter boxes, and soil used by cats. These carry germs that can cause birth defects in the baby.  Take your prenatal vitamins.  Try taking a stool softener (if your health care provider approves) if you develop constipation. Eat more high-fiber foods, such as fresh vegetables or fruit and whole grains. Drink plenty of fluids to keep your urine clear or pale yellow.  Take warm sitz baths to soothe any pain or discomfort caused by hemorrhoids. Use hemorrhoid cream if your health care provider approves.  If you develop varicose veins, wear support hose. Elevate your feet for 15 minutes, 3-4 times a day. Limit salt in your diet.  Avoid heavy lifting, wear low heel shoes, and practice good posture.  Rest with your legs elevated if you have leg cramps or low back pain.  Visit your dentist if you have not gone yet during your  pregnancy. Use a soft toothbrush to brush your teeth and be gentle when you floss.  A sexual relationship may be continued unless your health care provider directs you otherwise.  Continue to go to all your prenatal visits as directed by your health care provider. SEEK MEDICAL CARE IF:   You have dizziness.  You have mild pelvic cramps, pelvic pressure, or nagging pain in the abdominal area.  You have persistent nausea, vomiting, or diarrhea.  You have a bad smelling vaginal discharge.  You have pain with   urination. SEEK IMMEDIATE MEDICAL CARE IF:   You have a fever.  You are leaking fluid from your vagina.  You have spotting or bleeding from your vagina.  You have severe abdominal cramping or pain.  You have rapid weight gain or loss.  You have shortness of breath with chest pain.  You notice sudden or extreme swelling of your face, hands, ankles, feet, or legs.  You have not felt your baby move in over an hour.  You have severe headaches that do not go away with medicine.  You have vision changes. Document Released: 12/04/2001 Document Revised: 12/15/2013 Document Reviewed: 02/10/2013 [employer] Patient Information 2015 [employer]. This information is not intended to replace advice given to you by your health care provider. Make sure you discuss any questions you have with your health care provider.  Breastfeeding Deciding to breastfeed is one of the best choices you can make for you and your baby. A change in hormones during pregnancy causes your breast tissue to grow and increases the number and size of your milk ducts. These hormones also allow proteins, sugars, and fats from your blood supply to make breast milk in your milk-producing glands. Hormones prevent breast milk from being released before your baby is born as well as prompt milk flow after birth. Once breastfeeding has begun, thoughts of your baby, as well as his or her sucking or crying, can stimulate the  release of milk from your milk-producing glands.  BENEFITS OF BREASTFEEDING For Your Baby  Your first milk (colostrum) helps your baby's digestive system function better.   There are antibodies in your milk that help your baby fight off infections.   Your baby has a lower incidence of asthma, allergies, and sudden infant death syndrome.   The nutrients in breast milk are better for your baby than infant formulas and are designed uniquely for your baby's needs.   Breast milk improves your baby's brain development.   Your baby is less likely to develop other conditions, such as childhood obesity, asthma, or type 2 diabetes mellitus.  For You   Breastfeeding helps to create a very special bond between you and your baby.   Breastfeeding is convenient. Breast milk is always available at the correct temperature and costs nothing.   Breastfeeding helps to burn calories and helps you lose the weight gained during pregnancy.   Breastfeeding makes your uterus contract to its prepregnancy size faster and slows bleeding (lochia) after you give birth.   Breastfeeding helps to lower your risk of developing type 2 diabetes mellitus, osteoporosis, and breast or ovarian cancer later in life. SIGNS THAT YOUR BABY IS HUNGRY Early Signs of Hunger  Increased alertness or activity.  Stretching.  Movement of the head from side to side.  Movement of the head and opening of the mouth when the corner of the mouth or cheek is stroked (rooting).  Increased sucking sounds, smacking lips, cooing, sighing, or squeaking.  Hand-to-mouth movements.  Increased sucking of fingers or hands. Late Signs of Hunger  Fussing.  Intermittent crying. Extreme Signs of Hunger Signs of extreme hunger will require calming and consoling before your baby will be able to breastfeed successfully. Do not wait for the following signs of extreme hunger to occur before you initiate breastfeeding:    Restlessness.  A loud, strong cry.   Screaming. BREASTFEEDING BASICS Breastfeeding Initiation  Find a comfortable place to sit or lie down, with your neck and back well supported.  Place a pillow or   rolled up blanket under your baby to bring him or her to the level of your breast (if you are seated). Nursing pillows are specially designed to help support your arms and your baby while you breastfeed.  Make sure that your baby's abdomen is facing your abdomen.   Gently massage your breast. With your fingertips, massage from your chest wall toward your nipple in a circular motion. This encourages milk flow. You may need to continue this action during the feeding if your milk flows slowly.  Support your breast with 4 fingers underneath and your thumb above your nipple. Make sure your fingers are well away from your nipple and your baby's mouth.   Stroke your baby's lips gently with your finger or nipple.   When your baby's mouth is open wide enough, quickly bring your baby to your breast, placing your entire nipple and as much of the colored area around your nipple (areola) as possible into your baby's mouth.   More areola should be visible above your baby's upper lip than below the lower lip.   Your baby's tongue should be between his or her lower gum and your breast.   Ensure that your baby's mouth is correctly positioned around your nipple (latched). Your baby's lips should create a seal on your breast and be turned out (everted).  It is common for your baby to suck about 2-3 minutes in order to start the flow of breast milk. Latching Teaching your baby how to latch on to your breast properly is very important. An improper latch can cause nipple pain and decreased milk supply for you and poor weight gain in your baby. Also, if your baby is not latched onto your nipple properly, he or she may swallow some air during feeding. This can make your baby fussy. Burping your baby when  you switch breasts during the feeding can help to get rid of the air. However, teaching your baby to latch on properly is still the best way to prevent fussiness from swallowing air while breastfeeding. Signs that your baby has successfully latched on to your nipple:    Silent tugging or silent sucking, without causing you pain.   Swallowing heard between every 3-4 sucks.    Muscle movement above and in front of his or her ears while sucking.  Signs that your baby has not successfully latched on to nipple:   Sucking sounds or smacking sounds from your baby while breastfeeding.  Nipple pain. If you think your baby has not latched on correctly, slip your finger into the corner of your baby's mouth to break the suction and place it between your baby's gums. Attempt breastfeeding initiation again. Signs of Successful Breastfeeding Signs from your baby:   A gradual decrease in the number of sucks or complete cessation of sucking.   Falling asleep.   Relaxation of his or her body.   Retention of a small amount of milk in his or her mouth.   Letting go of your breast by himself or herself. Signs from you:  Breasts that have increased in firmness, weight, and size 1-3 hours after feeding.   Breasts that are softer immediately after breastfeeding.  Increased milk volume, as well as a change in milk consistency and color by the fifth day of breastfeeding.   Nipples that are not sore, cracked, or bleeding. Signs That Your Baby is Getting Enough Milk  Wetting at least 3 diapers in a 24-hour period. The urine should be clear and pale   yellow by age 5 days.  At least 3 stools in a 24-hour period by age 5 days. The stool should be soft and yellow.  At least 3 stools in a 24-hour period by age 7 days. The stool should be seedy and yellow.  No loss of weight greater than 10% of birth weight during the first 3 days of age.  Average weight gain of 4-7 ounces (113-198 g) per week  after age 4 days.  Consistent daily weight gain by age 5 days, without weight loss after the age of 2 weeks. After a feeding, your baby may spit up a small amount. This is common. BREASTFEEDING FREQUENCY AND DURATION Frequent feeding will help you make more milk and can prevent sore nipples and breast engorgement. Breastfeed when you feel the need to reduce the fullness of your breasts or when your baby shows signs of hunger. This is called "breastfeeding on demand." Avoid introducing a pacifier to your baby while you are working to establish breastfeeding (the first 4-6 weeks after your baby is born). After this time you may choose to use a pacifier. Research has shown that pacifier use during the first year of a baby's life decreases the risk of sudden infant death syndrome (SIDS). Allow your baby to feed on each breast as long as he or she wants. Breastfeed until your baby is finished feeding. When your baby unlatches or falls asleep while feeding from the first breast, offer the second breast. Because newborns are often sleepy in the first few weeks of life, you may need to awaken your baby to get him or her to feed. Breastfeeding times will vary from baby to baby. However, the following rules can serve as a guide to help you ensure that your baby is properly fed:  Newborns (babies 4 weeks of age or younger) may breastfeed every 1-3 hours.  Newborns should not go longer than 3 hours during the day or 5 hours during the night without breastfeeding.  You should breastfeed your baby a minimum of 8 times in a 24-hour period until you begin to introduce solid foods to your baby at around 6 months of age. BREAST MILK PUMPING Pumping and storing breast milk allows you to ensure that your baby is exclusively fed your breast milk, even at times when you are unable to breastfeed. This is especially important if you are going back to work while you are still breastfeeding or when you are not able to be  present during feedings. Your lactation consultant can give you guidelines on how long it is safe to store breast milk.  A breast pump is a machine that allows you to pump milk from your breast into a sterile bottle. The pumped breast milk can then be stored in a refrigerator or freezer. Some breast pumps are operated by hand, while others use electricity. Ask your lactation consultant which type will work best for you. Breast pumps can be purchased, but some hospitals and breastfeeding support groups lease breast pumps on a monthly basis. A lactation consultant can teach you how to hand express breast milk, if you prefer not to use a pump.  CARING FOR YOUR BREASTS WHILE YOU BREASTFEED Nipples can become dry, cracked, and sore while breastfeeding. The following recommendations can help keep your breasts moisturized and healthy:  Avoid using soap on your nipples.   Wear a supportive bra. Although not required, special nursing bras and tank tops are designed to allow access to your breasts for breastfeeding   without taking off your entire bra or top. Avoid wearing underwire-style bras or extremely tight bras.  Air dry your nipples for 3-4minutes after each feeding.   Use only cotton bra pads to absorb leaked breast milk. Leaking of breast milk between feedings is normal.   Use lanolin on your nipples after breastfeeding. Lanolin helps to maintain your skin's normal moisture barrier. If you use pure lanolin, you do not need to wash it off before feeding your baby again. Pure lanolin is not toxic to your baby. You may also hand express a few drops of breast milk and gently massage that milk into your nipples and allow the milk to air dry. In the first few weeks after giving birth, some women experience extremely full breasts (engorgement). Engorgement can make your breasts feel heavy, warm, and tender to the touch. Engorgement peaks within 3-5 days after you give birth. The following recommendations can  help ease engorgement:  Completely empty your breasts while breastfeeding or pumping. You may want to start by applying warm, moist heat (in the shower or with warm water-soaked hand towels) just before feeding or pumping. This increases circulation and helps the milk flow. If your baby does not completely empty your breasts while breastfeeding, pump any extra milk after he or she is finished.  Wear a snug bra (nursing or regular) or tank top for 1-2 days to signal your body to slightly decrease milk production.  Apply ice packs to your breasts, unless this is too uncomfortable for you.  Make sure that your baby is latched on and positioned properly while breastfeeding. If engorgement persists after 48 hours of following these recommendations, contact your health care provider or a lactation consultant. OVERALL HEALTH CARE RECOMMENDATIONS WHILE BREASTFEEDING  Eat healthy foods. Alternate between meals and snacks, eating 3 of each per day. Because what you eat affects your breast milk, some of the foods may make your baby more irritable than usual. Avoid eating these foods if you are sure that they are negatively affecting your baby.  Drink milk, fruit juice, and water to satisfy your thirst (about 10 glasses a day).   Rest often, relax, and continue to take your prenatal vitamins to prevent fatigue, stress, and anemia.  Continue breast self-awareness checks.  Avoid chewing and smoking tobacco.  Avoid alcohol and drug use. Some medicines that may be harmful to your baby can pass through breast milk. It is important to ask your health care provider before taking any medicine, including all over-the-counter and prescription medicine as well as vitamin and herbal supplements. It is possible to become pregnant while breastfeeding. If birth control is desired, ask your health care provider about options that will be safe for your baby. SEEK MEDICAL CARE IF:   You feel like you want to stop  breastfeeding or have become frustrated with breastfeeding.  You have painful breasts or nipples.  Your nipples are cracked or bleeding.  Your breasts are red, tender, or warm.  You have a swollen area on either breast.  You have a fever or chills.  You have nausea or vomiting.  You have drainage other than breast milk from your nipples.  Your breasts do not become full before feedings by the fifth day after you give birth.  You feel sad and depressed.  Your baby is too sleepy to eat well.  Your baby is having trouble sleeping.   Your baby is wetting less than 3 diapers in a 24-hour period.  Your baby has   less than 3 stools in a 24-hour period.  Your baby's skin or the white part of his or her eyes becomes yellow.   Your baby is not gaining weight by 5 days of age. SEEK IMMEDIATE MEDICAL CARE IF:   Your baby is overly tired (lethargic) and does not want to wake up and feed.  Your baby develops an unexplained fever. Document Released: 12/10/2005 Document Revised: 12/15/2013 Document Reviewed: 06/03/2013 [employer] Patient Information 2015 [employer]. This information is not intended to replace advice given to you by your health care provider. Make sure you discuss any questions you have with your health care provider.  

## 2014-12-28 NOTE — Progress Notes (Signed)
Reports FBS in the 90's 2 hour pp 137 after ginger ale She is doing a 1 hour today TDaP today

## 2014-12-29 LAB — CBC WITH DIFFERENTIAL/PLATELET
Basophils Absolute: 0 10*3/uL (ref 0.0–0.2)
Basos: 0 %
Eos: 3 %
Eosinophils Absolute: 0.3 10*3/uL (ref 0.0–0.4)
HCT: 37.5 % (ref 34.0–46.6)
Hemoglobin: 12.2 g/dL (ref 11.1–15.9)
Immature Grans (Abs): 0 10*3/uL (ref 0.0–0.1)
Immature Granulocytes: 0 %
Lymphocytes Absolute: 1.3 10*3/uL (ref 0.7–3.1)
Lymphs: 11 %
MCH: 26.2 pg — ABNORMAL LOW (ref 26.6–33.0)
MCHC: 32.5 g/dL (ref 31.5–35.7)
MCV: 81 fL (ref 79–97)
Monocytes Absolute: 0.8 10*3/uL (ref 0.1–0.9)
Monocytes: 7 %
Neutrophils Absolute: 9.3 10*3/uL — ABNORMAL HIGH (ref 1.4–7.0)
Neutrophils Relative %: 79 %
RBC: 4.66 x10E6/uL (ref 3.77–5.28)
RDW: 14.8 % (ref 12.3–15.4)
WBC: 11.7 10*3/uL — ABNORMAL HIGH (ref 3.4–10.8)

## 2014-12-29 LAB — RPR: SYPHILIS RPR SCR: NONREACTIVE

## 2014-12-29 LAB — HIV ANTIBODY (ROUTINE TESTING W REFLEX): HIV-1/HIV-2 Ab: NONREACTIVE

## 2014-12-29 LAB — GLUCOSE TOLERANCE, 1 HOUR: Glucose, 1Hr PP: 120 mg/dL (ref 65–199)

## 2015-01-07 ENCOUNTER — Telehealth: Payer: Self-pay | Admitting: *Deleted

## 2015-01-07 NOTE — Telephone Encounter (Signed)
Patient called to check the status of her glucose test results.  Her results are wnl.

## 2015-01-12 ENCOUNTER — Ambulatory Visit (INDEPENDENT_AMBULATORY_CARE_PROVIDER_SITE_OTHER): Payer: PRIVATE HEALTH INSURANCE | Admitting: Obstetrics and Gynecology

## 2015-01-12 ENCOUNTER — Encounter: Payer: Self-pay | Admitting: *Deleted

## 2015-01-12 ENCOUNTER — Encounter: Payer: Self-pay | Admitting: Obstetrics and Gynecology

## 2015-01-12 VITALS — BP 131/88 | HR 104 | Wt 331.6 lb

## 2015-01-12 DIAGNOSIS — Z3483 Encounter for supervision of other normal pregnancy, third trimester: Secondary | ICD-10-CM

## 2015-01-12 DIAGNOSIS — O99213 Obesity complicating pregnancy, third trimester: Secondary | ICD-10-CM

## 2015-01-12 NOTE — Progress Notes (Signed)
Fundus 2 FB below costal margin.  Hx GDM and 10#7 BW with P1. US at 7672w5d: 81st%ile and AV 95th, AFI 14> F/U in 4-6 wks.  1 hr GCT 120. Checks CBG's 1-2/day. Fastings mostly <90 with a few in the 90's-100. Most 2 hr PPs 120 or less with a few in 120's. (Did not bring log)  Avoids high carbs.  Wants to stop work due to necessity for heavy lifting at job. Has left abd rectus muscle pain.> Note to stop work.

## 2015-01-12 NOTE — Patient Instructions (Signed)
Third Trimester of Pregnancy The third trimester is from week 29 through week 42, months 7 through 9. The third trimester is a time when the fetus is growing rapidly. At the end of the ninth month, the fetus is about 20 inches in length and weighs 6-10 pounds.  BODY CHANGES Your body goes through many changes during pregnancy. The changes vary from woman to woman.   Your weight will continue to increase. You can expect to gain 25-35 pounds (11-16 kg) by the end of the pregnancy.  You may begin to get stretch marks on your hips, abdomen, and breasts.  You may urinate more often because the fetus is moving lower into your pelvis and pressing on your bladder.  You may develop or continue to have heartburn as a result of your pregnancy.  You may develop constipation because certain hormones are causing the muscles that push waste through your intestines to slow down.  You may develop hemorrhoids or swollen, bulging veins (varicose veins).  You may have pelvic pain because of the weight gain and pregnancy hormones relaxing your joints between the bones in your pelvis. Backaches may result from overexertion of the muscles supporting your posture.  You may have changes in your hair. These can include thickening of your hair, rapid growth, and changes in texture. Some women also have hair loss during or after pregnancy, or hair that feels dry or thin. Your hair will most likely return to normal after your baby is born.  Your breasts will continue to grow and be tender. A yellow discharge may leak from your breasts called colostrum.  Your belly button may stick out.  You may feel short of breath because of your expanding uterus.  You may notice the fetus "dropping," or moving lower in your abdomen.  You may have a bloody mucus discharge. This usually occurs a few days to a week before labor begins.  Your cervix becomes thin and soft (effaced) near your due date. WHAT TO EXPECT AT YOUR PRENATAL  EXAMS  You will have prenatal exams every 2 weeks until week 36. Then, you will have weekly prenatal exams. During a routine prenatal visit:  You will be weighed to make sure you and the fetus are growing normally.  Your blood pressure is taken.  Your abdomen will be measured to track your baby's growth.  The fetal heartbeat will be listened to.  Any test results from the previous visit will be discussed.  You may have a cervical check near your due date to see if you have effaced. At around 36 weeks, your caregiver will check your cervix. At the same time, your caregiver will also perform a test on the secretions of the vaginal tissue. This test is to determine if a type of bacteria, Group B streptococcus, is present. Your caregiver will explain this further. Your caregiver may ask you:  What your birth plan is.  How you are feeling.  If you are feeling the baby move.  If you have had any abnormal symptoms, such as leaking fluid, bleeding, severe headaches, or abdominal cramping.  If you have any questions. Other tests or screenings that may be performed during your third trimester include:  Blood tests that check for low iron levels (anemia).  Fetal testing to check the health, activity level, and growth of the fetus. Testing is done if you have certain medical conditions or if there are problems during the pregnancy. FALSE LABOR You may feel small, irregular contractions that   eventually go away. These are called Braxton Hicks contractions, or false labor. Contractions may last for hours, days, or even weeks before true labor sets in. If contractions come at regular intervals, intensify, or become painful, it is best to be seen by your caregiver.  SIGNS OF LABOR   Menstrual-like cramps.  Contractions that are 5 minutes apart or less.  Contractions that start on the top of the uterus and spread down to the lower abdomen and back.  A sense of increased pelvic pressure or back  pain.  A watery or bloody mucus discharge that comes from the vagina. If you have any of these signs before the 37th week of pregnancy, call your caregiver right away. You need to go to the hospital to get checked immediately. HOME CARE INSTRUCTIONS   Avoid all smoking, herbs, alcohol, and unprescribed drugs. These chemicals affect the formation and growth of the baby.  Follow your caregiver's instructions regarding medicine use. There are medicines that are either safe or unsafe to take during pregnancy.  Exercise only as directed by your caregiver. Experiencing uterine cramps is a good sign to stop exercising.  Continue to eat regular, healthy meals.  Wear a good support bra for breast tenderness.  Do not use hot tubs, steam rooms, or saunas.  Wear your seat belt at all times when driving.  Avoid raw meat, uncooked cheese, cat litter boxes, and soil used by cats. These carry germs that can cause birth defects in the baby.  Take your prenatal vitamins.  Try taking a stool softener (if your caregiver approves) if you develop constipation. Eat more high-fiber foods, such as fresh vegetables or fruit and whole grains. Drink plenty of fluids to keep your urine clear or pale yellow.  Take warm sitz baths to soothe any pain or discomfort caused by hemorrhoids. Use hemorrhoid cream if your caregiver approves.  If you develop varicose veins, wear support hose. Elevate your feet for 15 minutes, 3-4 times a day. Limit salt in your diet.  Avoid heavy lifting, wear low heal shoes, and practice good posture.  Rest a lot with your legs elevated if you have leg cramps or low back pain.  Visit your dentist if you have not gone during your pregnancy. Use a soft toothbrush to brush your teeth and be gentle when you floss.  A sexual relationship may be continued unless your caregiver directs you otherwise.  Do not travel far distances unless it is absolutely necessary and only with the approval  of your caregiver.  Take prenatal classes to understand, practice, and ask questions about the labor and delivery.  Make a trial run to the hospital.  Pack your hospital bag.  Prepare the baby's nursery.  Continue to go to all your prenatal visits as directed by your caregiver. SEEK MEDICAL CARE IF:  You are unsure if you are in labor or if your water has broken.  You have dizziness.  You have mild pelvic cramps, pelvic pressure, or nagging pain in your abdominal area.  You have persistent nausea, vomiting, or diarrhea.  You have a bad smelling vaginal discharge.  You have pain with urination. SEEK IMMEDIATE MEDICAL CARE IF:   You have a fever.  You are leaking fluid from your vagina.  You have spotting or bleeding from your vagina.  You have severe abdominal cramping or pain.  You have rapid weight loss or gain.  You have shortness of breath with chest pain.  You notice sudden or extreme swelling   of your face, hands, ankles, feet, or legs.  You have not felt your baby move in over an hour.  You have severe headaches that do not go away with medicine.  You have vision changes. Document Released: 12/04/2001 Document Revised: 12/15/2013 Document Reviewed: 02/10/2013 [facility] Patient Information 2015 [place], [place]. This information is not intended to replace advice given to you by your health care provider. Make sure you discuss any questions you have with your health care provider. Basic Carbohydrate Counting for Diabetes Mellitus Carbohydrate counting is a method for keeping track of the amount of carbohydrates you eat. Eating carbohydrates naturally increases the level of sugar (glucose) in your blood, so it is important for you to know the amount that is okay for you to have in every meal. Carbohydrate counting helps keep the level of glucose in your blood within normal limits. The amount of carbohydrates allowed is different for every person. A dietitian can help  you calculate the amount that is right for you. Once you know the amount of carbohydrates you can have, you can count the carbohydrates in the foods you want to eat. Carbohydrates are found in the following foods:  Grains, such as breads and cereals.  Dried beans and soy products.  Starchy vegetables, such as potatoes, peas, and corn.  Fruit and fruit juices.  Milk and yogurt.  Sweets and snack foods, such as cake, cookies, candy, chips, soft drinks, and fruit drinks. CARBOHYDRATE COUNTING There are two ways to count the carbohydrates in your food. You can use either of the methods or a combination of both. Reading the "Nutrition Facts" on Packaged Food The "Nutrition Facts" is an area that is included on the labels of almost all packaged food and beverages in the [place]. It includes the serving size of that food or beverage and information about the nutrients in each serving of the food, including the grams (g) of carbohydrate per serving.  Decide the number of servings of this food or beverage that you will be able to eat or drink. Multiply that number of servings by the number of grams of carbohydrate that is listed on the label for that serving. The total will be the amount of carbohydrates you will be having when you eat or drink this food or beverage. Learning Standard Serving Sizes of Food When you eat food that is not packaged or does not include "Nutrition Facts" on the label, you need to measure the servings in order to count the amount of carbohydrates.A serving of most carbohydrate-rich foods contains about 15 g of carbohydrates. The following list includes serving sizes of carbohydrate-rich foods that provide 15 g ofcarbohydrate per serving:   1 slice of bread (1 oz) or 1 six-inch tortilla.    of a hamburger bun or English muffin.  4-6 crackers.   cup unsweetened dry cereal.    cup hot cereal.   cup rice or pasta.    cup mashed potatoes or  of a large  baked potato.  1 cup fresh fruit or one small piece of fruit.    cup canned or frozen fruit or fruit juice.  1 cup milk.   cup plain fat-free yogurt or yogurt sweetened with artificial sweeteners.   cup cooked dried beans or starchy vegetable, such as peas, corn, or potatoes.  Decide the number of standard-size servings that you will eat. Multiply that number of servings by 15 (the grams of carbohydrates in that serving). For example, if you eat 2 cups  of strawberries, you will have eaten 2 servings and 30 g of carbohydrates (2 servings x 15 g = 30 g). For foods such as soups and casseroles, in which more than one food is mixed in, you will need to count the carbohydrates in each food that is included. EXAMPLE OF CARBOHYDRATE COUNTING Sample Dinner  3 oz chicken breast.   cup of brown rice.   cup of corn.  1 cup milk.   1 cup strawberries with sugar-free whipped topping.  Carbohydrate Calculation Step 1: Identify the foods that contain carbohydrates:   Rice.   Corn.   Milk.   Strawberries. Step 2:Calculate the number of servings eaten of each:   2 servings of rice.   1 serving of corn.   1 serving of milk.   1 serving of strawberries. Step 3: Multiply each of those number of servings by 15 g:   2 servings of rice x 15 g = 30 g.   1 serving of corn x 15 g = 15 g.   1 serving of milk x 15 g = 15 g.   1 serving of strawberries x 15 g = 15 g. Step 4: Add together all of the amounts to find the total grams of carbohydrates eaten: 30 g + 15 g + 15 g + 15 g = 75 g. Document Released: 12/10/2005 Document Revised: 04/26/2014 Document Reviewed: 11/06/2013 [facility] Patient Information 2015 [place]. This information is not intended to replace advice given to you by your health care provider. Make sure you discuss any questions you have with your health care provider.

## 2015-01-12 NOTE — Progress Notes (Signed)
Patient is getting over a bronchitis, she just finished a course of amoxicillin.

## 2015-01-26 ENCOUNTER — Ambulatory Visit (INDEPENDENT_AMBULATORY_CARE_PROVIDER_SITE_OTHER): Payer: PRIVATE HEALTH INSURANCE | Admitting: Obstetrics & Gynecology

## 2015-01-26 VITALS — BP 135/93 | HR 95 | Wt 334.0 lb

## 2015-01-26 DIAGNOSIS — O99213 Obesity complicating pregnancy, third trimester: Secondary | ICD-10-CM

## 2015-01-26 DIAGNOSIS — Z8632 Personal history of gestational diabetes: Principal | ICD-10-CM

## 2015-01-26 DIAGNOSIS — O09293 Supervision of pregnancy with other poor reproductive or obstetric history, third trimester: Secondary | ICD-10-CM

## 2015-01-26 NOTE — Progress Notes (Signed)
Did not bring log book; in process of getting new meter HgA1C to be checked today.  Monitor BP. Growth scan next week No other complaints or concerns.  Labor and fetal movement precautions reviewed.

## 2015-01-26 NOTE — Patient Instructions (Signed)
Return to clinic for any obstetric concerns or go to [facility] for evaluation  

## 2015-01-27 LAB — HEMOGLOBIN A1C
Hgb A1c MFr Bld: 6.3 % — ABNORMAL HIGH (ref <5.7)
Mean Plasma Glucose: 134 mg/dL — ABNORMAL HIGH (ref <117)

## 2015-02-02 ENCOUNTER — Ambulatory Visit (HOSPITAL_COMMUNITY)
Admission: RE | Admit: 2015-02-02 | Discharge: 2015-02-02 | Disposition: A | Discharge status: Home / Self Care | Payer: PRIVATE HEALTH INSURANCE | Source: Ambulatory Visit | Attending: Obstetrics and Gynecology | Admitting: Obstetrics and Gynecology

## 2015-02-02 DIAGNOSIS — Z36 Encounter for antenatal screening of mother: Secondary | ICD-10-CM | POA: Insufficient documentation

## 2015-02-02 DIAGNOSIS — Z3A32 32 weeks gestation of pregnancy: Secondary | ICD-10-CM | POA: Diagnosis not present

## 2015-02-02 DIAGNOSIS — O99213 Obesity complicating pregnancy, third trimester: Secondary | ICD-10-CM

## 2015-02-02 DIAGNOSIS — Z8759 Personal history of other complications of pregnancy, childbirth and the puerperium: Secondary | ICD-10-CM

## 2015-02-02 DIAGNOSIS — O09293 Supervision of pregnancy with other poor reproductive or obstetric history, third trimester: Secondary | ICD-10-CM | POA: Diagnosis not present

## 2015-02-09 ENCOUNTER — Ambulatory Visit (INDEPENDENT_AMBULATORY_CARE_PROVIDER_SITE_OTHER): Payer: PRIVATE HEALTH INSURANCE | Admitting: Obstetrics and Gynecology

## 2015-02-09 ENCOUNTER — Encounter: Payer: Self-pay | Admitting: Obstetrics and Gynecology

## 2015-02-09 VITALS — BP 130/83 | HR 108 | Wt 336.0 lb

## 2015-02-09 DIAGNOSIS — O99213 Obesity complicating pregnancy, third trimester: Secondary | ICD-10-CM

## 2015-02-09 DIAGNOSIS — Z3493 Encounter for supervision of normal pregnancy, unspecified, third trimester: Secondary | ICD-10-CM

## 2015-02-09 DIAGNOSIS — Z8759 Personal history of other complications of pregnancy, childbirth and the puerperium: Secondary | ICD-10-CM

## 2015-02-09 DIAGNOSIS — Z6841 Body Mass Index (BMI) 40.0 and over, adult: Secondary | ICD-10-CM

## 2015-02-09 NOTE — Progress Notes (Signed)
Patient is doing well without complaints. FM/PTL precautions reviewed.  CBGs - Patient did not replace meter yet. Patient is aware of rise in HgA1C. Bordeline BP today without symptoms- will continue to monitor closely 2/10 EFW 2245 gm (74%tile)

## 2015-02-24 ENCOUNTER — Encounter: Payer: Self-pay | Admitting: Family Medicine

## 2015-02-24 ENCOUNTER — Ambulatory Visit (INDEPENDENT_AMBULATORY_CARE_PROVIDER_SITE_OTHER): Payer: PRIVATE HEALTH INSURANCE | Admitting: Family Medicine

## 2015-02-24 VITALS — BP 130/96 | HR 103 | Wt 337.4 lb

## 2015-02-24 DIAGNOSIS — O24414 Gestational diabetes mellitus in pregnancy, insulin controlled: Secondary | ICD-10-CM

## 2015-02-24 DIAGNOSIS — Z3493 Encounter for supervision of normal pregnancy, unspecified, third trimester: Secondary | ICD-10-CM

## 2015-02-24 DIAGNOSIS — Z3A36 36 weeks gestation of pregnancy: Secondary | ICD-10-CM

## 2015-02-24 DIAGNOSIS — Z36 Encounter for antenatal screening of mother: Secondary | ICD-10-CM

## 2015-02-24 DIAGNOSIS — E669 Obesity, unspecified: Secondary | ICD-10-CM

## 2015-02-24 DIAGNOSIS — O99210 Obesity complicating pregnancy, unspecified trimester: Secondary | ICD-10-CM

## 2015-02-24 LAB — OB RESULTS CONSOLE GC/CHLAMYDIA
Chlamydia: NEGATIVE
Gonorrhea: NEGATIVE

## 2015-02-24 LAB — OB RESULTS CONSOLE GBS: GBS: POSITIVE

## 2015-02-24 NOTE — Progress Notes (Signed)
Reports BS have been low due to emesis Reports fastings are in the 70's and pp < 110.  Does not have log. Cultures today. F/u U/s for growth. Reports decreased FM for NST NST reviewed and reactive.

## 2015-02-24 NOTE — Progress Notes (Signed)
Patient is having increase in pressure in back but not a lot of contractions.  No blood, small amount of increased mucous discharge.  Having trouble sleeping but this is normal for her in pregnancy.

## 2015-02-25 LAB — GC/CHLAMYDIA PROBE AMP
CT Probe RNA: NEGATIVE
GC PROBE AMP APTIMA: NEGATIVE

## 2015-02-27 LAB — CULTURE, BETA STREP (GROUP B ONLY)

## 2015-02-28 ENCOUNTER — Encounter: Payer: Self-pay | Admitting: Family Medicine

## 2015-02-28 DIAGNOSIS — O99820 Streptococcus B carrier state complicating pregnancy: Secondary | ICD-10-CM | POA: Insufficient documentation

## 2015-03-03 ENCOUNTER — Ambulatory Visit (INDEPENDENT_AMBULATORY_CARE_PROVIDER_SITE_OTHER): Payer: PRIVATE HEALTH INSURANCE | Admitting: Obstetrics & Gynecology

## 2015-03-03 ENCOUNTER — Encounter: Payer: Self-pay | Admitting: Obstetrics & Gynecology

## 2015-03-03 VITALS — BP 121/84 | HR 106 | Wt 340.8 lb

## 2015-03-03 DIAGNOSIS — O0993 Supervision of high risk pregnancy, unspecified, third trimester: Secondary | ICD-10-CM

## 2015-03-03 DIAGNOSIS — Z8632 Personal history of gestational diabetes: Secondary | ICD-10-CM

## 2015-03-03 DIAGNOSIS — O99820 Streptococcus B carrier state complicating pregnancy: Secondary | ICD-10-CM

## 2015-03-03 DIAGNOSIS — Z22330 Carrier of Group B streptococcus: Secondary | ICD-10-CM

## 2015-03-03 DIAGNOSIS — O09293 Supervision of pregnancy with other poor reproductive or obstetric history, third trimester: Secondary | ICD-10-CM

## 2015-03-03 DIAGNOSIS — O133 Gestational [pregnancy-induced] hypertension without significant proteinuria, third trimester: Secondary | ICD-10-CM

## 2015-03-03 NOTE — Progress Notes (Signed)
Normal 1 hr GCT of 120 at 3766w5d and HgA1C was 6.3.  Patient still checks fingersticks periodically to ensure her levels are within range. Did not bring sugar log, but reports that BS are within range.   Informed of GBS positive status, need to be treated in labor On review of BP, patient had two DBP >90 at 2064w6d and 2576w0d; but also had BP of 155/80 at 6119w0d.  She has a history of GHTN in last pregnancy requiring Aldomet 125 mg bid.   Denies any headaches, visual symptoms, RUQ pain and BP today is normal at 121/84.  Patient is also obese, and wonders if the 18 week reading was erroneous because of wrong cuff size.  All the same, [facility] and she was told that current standard of care was to move towards delivery at 37 weeks.  Patient declines the option of induction of labor now; but agrees to twice a week antenatal testing which started today with NST.   NST performed today was reviewed and was found to be reactive.   CBC, CMET, urine Pr:Cr checked today, will follow up results and manage accordingly. Follow up growth scan and BPP will be ordered next week (03/07/15) , then patient will come here for OB visit and NST later in the week (03/10/15). Patient understands that if she has another elevated SBP 140 or above, or DBP 90 or above, delivery will be indicated and heavily recommended. For now, given GHTN or possible CHTN, she is willing to consider IOL at 39 weeks if her BP remains stable. Preeclampsia precautions strictly emphasized, also labor and fetal movement precautions reviewed.

## 2015-03-03 NOTE — Patient Instructions (Signed)

## 2015-03-04 LAB — COMPREHENSIVE METABOLIC PANEL
A/G RATIO: 1.2 (ref 1.1–2.5)
ALBUMIN: 2.9 g/dL — AB (ref 3.5–5.5)
ALT: 18 IU/L (ref 0–32)
AST: 19 IU/L (ref 0–40)
Alkaline Phosphatase: 94 IU/L (ref 39–117)
BUN/Creatinine Ratio: 15 (ref 8–20)
BUN: 7 mg/dL (ref 6–20)
CO2: 19 mmol/L (ref 18–29)
CREATININE: 0.47 mg/dL — AB (ref 0.57–1.00)
Calcium: 8.8 mg/dL (ref 8.7–10.2)
Chloride: 104 mmol/L (ref 97–108)
GFR, EST AFRICAN AMERICAN: 151 mL/min/{1.73_m2} (ref >59)
GFR, EST NON AFRICAN AMERICAN: 131 mL/min/{1.73_m2} (ref >59)
GLOBULIN, TOTAL: 2.4 g/dL (ref 1.5–4.5)
GLUCOSE: 132 mg/dL — AB (ref 65–99)
Potassium: 4.2 mmol/L (ref 3.5–5.2)
Sodium: 138 mmol/L (ref 134–144)
TOTAL PROTEIN: 5.3 g/dL — AB (ref 6.0–8.5)

## 2015-03-04 LAB — CBC
HCT: 36.9 % (ref 34.0–46.6)
Hemoglobin: 11.6 g/dL (ref 11.1–15.9)
MCH: 25.2 pg — ABNORMAL LOW (ref 26.6–33.0)
MCHC: 31.4 g/dL — ABNORMAL LOW (ref 31.5–35.7)
MCV: 80 fL (ref 79–97)
PLATELETS: 243 10*3/uL (ref 150–379)
RBC: 4.61 x10E6/uL (ref 3.77–5.28)
RDW: 14.7 % (ref 12.3–15.4)
WBC: 12.7 10*3/uL — AB (ref 3.4–10.8)

## 2015-03-04 LAB — PROTEIN / CREATININE RATIO, URINE
CREATININE, UR: 158.8 mg/dL (ref 16.0–327.0)
PROTEIN/CREAT RATIO: 105 mg/g{creat} (ref 0–200)
Protein, Ur: 16.7 mg/dL — ABNORMAL HIGH (ref 0.0–15.0)

## 2015-03-07 ENCOUNTER — Ambulatory Visit (HOSPITAL_COMMUNITY)
Admission: RE | Admit: 2015-03-07 | Discharge: 2015-03-07 | Disposition: A | Discharge status: Home / Self Care | Payer: PRIVATE HEALTH INSURANCE | Source: Ambulatory Visit | Attending: Obstetrics & Gynecology | Admitting: Obstetrics & Gynecology

## 2015-03-07 DIAGNOSIS — Z6841 Body Mass Index (BMI) 40.0 and over, adult: Secondary | ICD-10-CM | POA: Insufficient documentation

## 2015-03-07 DIAGNOSIS — O322XX0 Maternal care for transverse and oblique lie, not applicable or unspecified: Secondary | ICD-10-CM | POA: Insufficient documentation

## 2015-03-07 DIAGNOSIS — O09293 Supervision of pregnancy with other poor reproductive or obstetric history, third trimester: Secondary | ICD-10-CM | POA: Insufficient documentation

## 2015-03-07 DIAGNOSIS — O99210 Obesity complicating pregnancy, unspecified trimester: Secondary | ICD-10-CM

## 2015-03-07 DIAGNOSIS — O3660X0 Maternal care for excessive fetal growth, unspecified trimester, not applicable or unspecified: Secondary | ICD-10-CM | POA: Insufficient documentation

## 2015-03-07 DIAGNOSIS — Z3A37 37 weeks gestation of pregnancy: Secondary | ICD-10-CM | POA: Insufficient documentation

## 2015-03-07 DIAGNOSIS — O99213 Obesity complicating pregnancy, third trimester: Secondary | ICD-10-CM | POA: Insufficient documentation

## 2015-03-07 DIAGNOSIS — O133 Gestational [pregnancy-induced] hypertension without significant proteinuria, third trimester: Secondary | ICD-10-CM

## 2015-03-10 ENCOUNTER — Encounter: Payer: Self-pay | Admitting: Obstetrics and Gynecology

## 2015-03-10 ENCOUNTER — Ambulatory Visit (INDEPENDENT_AMBULATORY_CARE_PROVIDER_SITE_OTHER): Payer: PRIVATE HEALTH INSURANCE | Admitting: Obstetrics and Gynecology

## 2015-03-10 VITALS — BP 132/86 | HR 108 | Wt 340.0 lb

## 2015-03-10 DIAGNOSIS — Z22330 Carrier of Group B streptococcus: Secondary | ICD-10-CM

## 2015-03-10 DIAGNOSIS — O322XX1 Maternal care for transverse and oblique lie, fetus 1: Secondary | ICD-10-CM

## 2015-03-10 DIAGNOSIS — O09293 Supervision of pregnancy with other poor reproductive or obstetric history, third trimester: Secondary | ICD-10-CM

## 2015-03-10 DIAGNOSIS — O3663X1 Maternal care for excessive fetal growth, third trimester, fetus 1: Secondary | ICD-10-CM

## 2015-03-10 DIAGNOSIS — O133 Gestational [pregnancy-induced] hypertension without significant proteinuria, third trimester: Secondary | ICD-10-CM

## 2015-03-10 DIAGNOSIS — Z6841 Body Mass Index (BMI) 40.0 and over, adult: Secondary | ICD-10-CM

## 2015-03-10 DIAGNOSIS — O99820 Streptococcus B carrier state complicating pregnancy: Secondary | ICD-10-CM

## 2015-03-10 DIAGNOSIS — Z8632 Personal history of gestational diabetes: Secondary | ICD-10-CM

## 2015-03-10 DIAGNOSIS — Z8759 Personal history of other complications of pregnancy, childbirth and the puerperium: Secondary | ICD-10-CM

## 2015-03-10 DIAGNOSIS — O0993 Supervision of high risk pregnancy, unspecified, third trimester: Secondary | ICD-10-CM

## 2015-03-10 NOTE — Progress Notes (Signed)
Patient is doing well without complaints. Ultrasound results reviewed with the patient. Patient did not bring CBG logs but reports fasting in 80's and postprandial 100-110's. NST reviewed and reactive Ultrasound shows fetus in vertex position Will schedule IOL at 39 weeks on 03/17/2015

## 2015-03-15 ENCOUNTER — Telehealth (HOSPITAL_COMMUNITY): Payer: Self-pay | Admitting: *Deleted

## 2015-03-15 NOTE — Telephone Encounter (Signed)
Preadmission screen  

## 2015-03-16 ENCOUNTER — Telehealth (HOSPITAL_COMMUNITY): Payer: Self-pay | Admitting: *Deleted

## 2015-03-16 NOTE — Telephone Encounter (Signed)
Preadmission screen  

## 2015-03-17 ENCOUNTER — Encounter (HOSPITAL_COMMUNITY): Payer: Self-pay

## 2015-03-17 ENCOUNTER — Inpatient Hospital Stay (HOSPITAL_COMMUNITY): Payer: PRIVATE HEALTH INSURANCE

## 2015-03-17 ENCOUNTER — Inpatient Hospital Stay (HOSPITAL_COMMUNITY)
Admission: RE | Admit: 2015-03-17 | Discharge: 2015-03-20 | DRG: 775 | Disposition: A | Discharge status: Home / Self Care | Payer: PRIVATE HEALTH INSURANCE | Source: Ambulatory Visit | Attending: Family Medicine | Admitting: Family Medicine

## 2015-03-17 VITALS — BP 127/54 | HR 81 | Temp 97.8°F | Resp 19 | Ht 66.0 in | Wt 340.0 lb

## 2015-03-17 DIAGNOSIS — O99214 Obesity complicating childbirth: Secondary | ICD-10-CM | POA: Diagnosis present

## 2015-03-17 DIAGNOSIS — O9962 Diseases of the digestive system complicating childbirth: Secondary | ICD-10-CM | POA: Diagnosis present

## 2015-03-17 DIAGNOSIS — Z3A39 39 weeks gestation of pregnancy: Secondary | ICD-10-CM | POA: Diagnosis present

## 2015-03-17 DIAGNOSIS — K219 Gastro-esophageal reflux disease without esophagitis: Secondary | ICD-10-CM | POA: Diagnosis present

## 2015-03-17 DIAGNOSIS — O99213 Obesity complicating pregnancy, third trimester: Secondary | ICD-10-CM

## 2015-03-17 DIAGNOSIS — O329XX0 Maternal care for malpresentation of fetus, unspecified, not applicable or unspecified: Secondary | ICD-10-CM | POA: Insufficient documentation

## 2015-03-17 DIAGNOSIS — O133 Gestational [pregnancy-induced] hypertension without significant proteinuria, third trimester: Principal | ICD-10-CM | POA: Diagnosis present

## 2015-03-17 DIAGNOSIS — F17200 Nicotine dependence, unspecified, uncomplicated: Secondary | ICD-10-CM | POA: Diagnosis present

## 2015-03-17 DIAGNOSIS — O321XX0 Maternal care for breech presentation, not applicable or unspecified: Secondary | ICD-10-CM | POA: Diagnosis present

## 2015-03-17 DIAGNOSIS — Z823 Family history of stroke: Secondary | ICD-10-CM

## 2015-03-17 DIAGNOSIS — Z8759 Personal history of other complications of pregnancy, childbirth and the puerperium: Secondary | ICD-10-CM

## 2015-03-17 DIAGNOSIS — Z6841 Body Mass Index (BMI) 40.0 and over, adult: Secondary | ICD-10-CM

## 2015-03-17 DIAGNOSIS — Z8632 Personal history of gestational diabetes: Secondary | ICD-10-CM

## 2015-03-17 DIAGNOSIS — O99820 Streptococcus B carrier state complicating pregnancy: Secondary | ICD-10-CM

## 2015-03-17 DIAGNOSIS — O0993 Supervision of high risk pregnancy, unspecified, third trimester: Secondary | ICD-10-CM

## 2015-03-17 DIAGNOSIS — O3663X0 Maternal care for excessive fetal growth, third trimester, not applicable or unspecified: Secondary | ICD-10-CM | POA: Insufficient documentation

## 2015-03-17 DIAGNOSIS — O99334 Smoking (tobacco) complicating childbirth: Secondary | ICD-10-CM | POA: Diagnosis present

## 2015-03-17 DIAGNOSIS — O09293 Supervision of pregnancy with other poor reproductive or obstetric history, third trimester: Secondary | ICD-10-CM

## 2015-03-17 DIAGNOSIS — O321XX1 Maternal care for breech presentation, fetus 1: Secondary | ICD-10-CM | POA: Diagnosis not present

## 2015-03-17 DIAGNOSIS — Z833 Family history of diabetes mellitus: Secondary | ICD-10-CM

## 2015-03-17 DIAGNOSIS — O26843 Uterine size-date discrepancy, third trimester: Secondary | ICD-10-CM | POA: Diagnosis not present

## 2015-03-17 DIAGNOSIS — O99824 Streptococcus B carrier state complicating childbirth: Secondary | ICD-10-CM | POA: Diagnosis present

## 2015-03-17 DIAGNOSIS — O139 Gestational [pregnancy-induced] hypertension without significant proteinuria, unspecified trimester: Secondary | ICD-10-CM | POA: Diagnosis present

## 2015-03-17 LAB — COMPREHENSIVE METABOLIC PANEL
ALT: 23 U/L (ref 0–35)
AST: 20 U/L (ref 0–37)
Albumin: 2.2 g/dL — ABNORMAL LOW (ref 3.5–5.2)
Alkaline Phosphatase: 93 U/L (ref 39–117)
Anion gap: 8 (ref 5–15)
BUN: 9 mg/dL (ref 6–23)
CALCIUM: 8.3 mg/dL — AB (ref 8.4–10.5)
CO2: 22 mmol/L (ref 19–32)
Chloride: 104 mmol/L (ref 96–112)
Creatinine, Ser: 0.62 mg/dL (ref 0.50–1.10)
GLUCOSE: 182 mg/dL — AB (ref 70–99)
Potassium: 4.1 mmol/L (ref 3.5–5.1)
Sodium: 134 mmol/L — ABNORMAL LOW (ref 135–145)
TOTAL PROTEIN: 5.3 g/dL — AB (ref 6.0–8.3)
Total Bilirubin: <0.1 mg/dL — ABNORMAL LOW (ref 0.3–1.2)

## 2015-03-17 LAB — CBC
HCT: 34.8 % — ABNORMAL LOW (ref 36.0–46.0)
Hemoglobin: 11.1 g/dL — ABNORMAL LOW (ref 12.0–15.0)
MCH: 25.1 pg — ABNORMAL LOW (ref 26.0–34.0)
MCHC: 31.9 g/dL (ref 30.0–36.0)
MCV: 78.6 fL (ref 78.0–100.0)
Platelets: 255 K/uL (ref 150–400)
RBC: 4.43 MIL/uL (ref 3.87–5.11)
RDW: 15.4 % (ref 11.5–15.5)
WBC: 14.2 K/uL — ABNORMAL HIGH (ref 4.0–10.5)

## 2015-03-17 LAB — PROTEIN / CREATININE RATIO, URINE
Creatinine, Urine: 210 mg/dL
PROTEIN CREATININE RATIO: 0.1 (ref 0.00–0.15)
TOTAL PROTEIN, URINE: 21 mg/dL

## 2015-03-17 LAB — TYPE AND SCREEN
ABO/RH(D): O POS
Antibody Screen: NEGATIVE

## 2015-03-17 LAB — GLUCOSE, CAPILLARY
GLUCOSE-CAPILLARY: 204 mg/dL — AB (ref 70–99)
Glucose-Capillary: 83 mg/dL (ref 70–99)
Glucose-Capillary: 105 mg/dL — ABNORMAL HIGH (ref 70–99)

## 2015-03-17 LAB — RPR: RPR Ser Ql: NONREACTIVE

## 2015-03-17 LAB — ABO/RH: ABO/RH(D): O POS

## 2015-03-17 MED ORDER — OXYCODONE-ACETAMINOPHEN 5-325 MG PO TABS: 1.0000 | ORAL_TABLET | ORAL | Status: DC | PRN

## 2015-03-17 MED ORDER — ACETAMINOPHEN 325 MG PO TABS: 650.0000 mg | ORAL_TABLET | ORAL | Status: DC | PRN

## 2015-03-17 MED ORDER — MISOPROSTOL 25 MCG QUARTER TABLET
25.0000 ug | ORAL_TABLET | ORAL | Status: DC | PRN
  Administered 2015-03-17 – 2015-03-18 (×4): 25 ug via VAGINAL
  Filled 2015-03-17 (×2): qty 0.25
  Filled 2015-03-17: qty 1
  Filled 2015-03-17 (×2): qty 0.25

## 2015-03-17 MED ORDER — PENICILLIN G POTASSIUM 5000000 UNITS IJ SOLR
5.0000 10*6.[IU] | Freq: Once | INTRAVENOUS | Status: DC | PRN
  Filled 2015-03-17: qty 5

## 2015-03-17 MED ORDER — LACTATED RINGERS IV SOLN
INTRAVENOUS | Status: DC
  Administered 2015-03-17 – 2015-03-19 (×5): via INTRAVENOUS

## 2015-03-17 MED ORDER — LACTATED RINGERS IV SOLN: 500.0000 mL | INTRAVENOUS | Status: DC | PRN

## 2015-03-17 MED ORDER — PENICILLIN G POTASSIUM 5000000 UNITS IJ SOLR
2.5000 10*6.[IU] | INTRAVENOUS | Status: DC
  Filled 2015-03-17 (×2): qty 2.5

## 2015-03-17 MED ORDER — EPHEDRINE 5 MG/ML INJ
10.0000 mg | INTRAVENOUS | Status: DC | PRN
  Filled 2015-03-17: qty 2

## 2015-03-17 MED ORDER — LACTATED RINGERS IV SOLN: 500.0000 mL | Freq: Once | INTRAVENOUS | Status: DC

## 2015-03-17 MED ORDER — OXYTOCIN BOLUS FROM INFUSION
500.0000 mL | INTRAVENOUS | Status: DC
  Administered 2015-03-19: 500 mL via INTRAVENOUS

## 2015-03-17 MED ORDER — OXYCODONE-ACETAMINOPHEN 5-325 MG PO TABS: 2.0000 | ORAL_TABLET | ORAL | Status: DC | PRN

## 2015-03-17 MED ORDER — LIDOCAINE HCL (PF) 1 % IJ SOLN
30.0000 mL | INTRAMUSCULAR | Status: DC | PRN
  Filled 2015-03-17: qty 30

## 2015-03-17 MED ORDER — TERBUTALINE SULFATE 1 MG/ML IJ SOLN: 0.2500 mg | Freq: Once | INTRAMUSCULAR | Status: AC | PRN

## 2015-03-17 MED ORDER — PENICILLIN G POTASSIUM 5000000 UNITS IJ SOLR
5.0000 10*6.[IU] | Freq: Once | INTRAVENOUS | Status: DC
  Filled 2015-03-17: qty 5

## 2015-03-17 MED ORDER — CITRIC ACID-SODIUM CITRATE 334-500 MG/5ML PO SOLN
30.0000 mL | ORAL | Status: DC | PRN
  Filled 2015-03-17: qty 15

## 2015-03-17 MED ORDER — DIPHENHYDRAMINE HCL 50 MG/ML IJ SOLN
12.5000 mg | INTRAMUSCULAR | Status: DC | PRN
Start: 2015-03-17 — End: 2015-03-19

## 2015-03-17 MED ORDER — INSULIN ASPART 100 UNIT/ML ~~LOC~~ SOLN: 0.0000 [IU] | SUBCUTANEOUS | Status: DC

## 2015-03-17 MED ORDER — OXYTOCIN 40 UNITS IN LACTATED RINGERS INFUSION - SIMPLE MED
62.5000 mL/h | INTRAVENOUS | Status: DC
  Administered 2015-03-19: 62.5 mL/h via INTRAVENOUS
  Filled 2015-03-17: qty 1000

## 2015-03-17 MED ORDER — EPHEDRINE 5 MG/ML INJ
10.0000 mg | INTRAVENOUS | Status: DC | PRN
Start: 2015-03-17 — End: 2015-03-19
  Filled 2015-03-17: qty 2

## 2015-03-17 MED ORDER — PHENYLEPHRINE 40 MCG/ML (10ML) SYRINGE FOR IV PUSH (FOR BLOOD PRESSURE SUPPORT)
80.0000 ug | PREFILLED_SYRINGE | INTRAVENOUS | Status: DC | PRN
  Filled 2015-03-17: qty 2

## 2015-03-17 MED ORDER — ONDANSETRON HCL 4 MG/2ML IJ SOLN: 4.0000 mg | Freq: Four times a day (QID) | INTRAMUSCULAR | Status: DC | PRN

## 2015-03-17 MED ORDER — PHENYLEPHRINE 40 MCG/ML (10ML) SYRINGE FOR IV PUSH (FOR BLOOD PRESSURE SUPPORT)
80.0000 ug | PREFILLED_SYRINGE | INTRAVENOUS | Status: DC | PRN
  Filled 2015-03-17: qty 20
  Filled 2015-03-17: qty 2

## 2015-03-17 MED ORDER — FENTANYL 2.5 MCG/ML BUPIVACAINE 1/10 % EPIDURAL INFUSION (WH - ANES)
14.0000 mL/h | INTRAMUSCULAR | Status: DC | PRN
  Administered 2015-03-18: 14 mL/h via EPIDURAL
  Filled 2015-03-17: qty 125

## 2015-03-17 MED ORDER — PENICILLIN G POTASSIUM 5000000 UNITS IJ SOLR
2.5000 10*6.[IU] | INTRAVENOUS | Status: DC | PRN
  Filled 2015-03-17: qty 2.5

## 2015-03-17 NOTE — Progress Notes (Signed)
Patient ID: Dawn Fitzpatrick, female   DOB: Aug 16, 1981, 34 y.o.   MRN: [id number] Dawn Fitzpatrick is a 34 y.o. G3P2002 at 4663w0d.  Subjective: Comfortable  Objective: BP 128/73 mmHg  Pulse 88  Temp(Src) 98 F (36.7 C) (Oral)  Ht 5\' 6"  (1.676 m)  Wt 340 lb (154.223 kg)  BMI 54.90 kg/m2  LMP 06/17/2014   FHT:  FHR: 140 bpm, variability: mod,  accelerations:  15x15,  decelerations:  none UC:   Q 2-4, minutes, mild  Dilation: 1 Effacement (%): Thick Station: -3 Presentation: Vertex Exam by:: Katrinka BlazingSmith, CNM  Labs: Results for orders placed or performed during the hospital encounter of 03/17/15 (from the past 24 hour(s))  CBC     Status: Abnormal   Collection Time: 03/17/15  8:45 AM  Result Value Ref Range   WBC 14.2 (H) 4.0 - 10.5 K/uL   RBC 4.43 3.87 - 5.11 MIL/uL   Hemoglobin 11.1 (L) 12.0 - 15.0 g/dL   HCT 84.634.8 (L) 96.236.0 - 95.246.0 %   MCV 78.6 78.0 - 100.0 fL   MCH 25.1 (L) 26.0 - 34.0 pg   MCHC 31.9 30.0 - 36.0 g/dL   RDW 84.115.4 32.411.5 - 40.115.5 %   Platelets 255 150 - 400 K/uL  Type and screen     Status: None   Collection Time: 03/17/15  8:45 AM  Result Value Ref Range   ABO/RH(D) O POS    Antibody Screen NEG    Sample Expiration 03/20/2015   Comprehensive metabolic panel     Status: Abnormal   Collection Time: 03/17/15  8:45 AM  Result Value Ref Range   Sodium 134 (L) 135 - 145 mmol/L   Potassium 4.1 3.5 - 5.1 mmol/L   Chloride 104 96 - 112 mmol/L   CO2 22 19 - 32 mmol/L   Glucose, Bld 182 (H) 70 - 99 mg/dL   BUN 9 6 - 23 mg/dL   Creatinine, Ser 0.270.62 0.50 - 1.10 mg/dL   Calcium 8.3 (L) 8.4 - 10.5 mg/dL   Total Protein 5.3 (L) 6.0 - 8.3 g/dL   Albumin 2.2 (L) 3.5 - 5.2 g/dL   AST 20 0 - 37 U/L   ALT 23 0 - 35 U/L   Alkaline Phosphatase 93 39 - 117 U/L   Total Bilirubin <0.1 (L) 0.3 - 1.2 mg/dL   GFR calc non Af Amer >90 >90 mL/min   GFR calc Af Amer >90 >90 mL/min   Anion gap 8 5 - 15  ABO/Rh     Status: None   Collection Time: 03/17/15  8:45 AM  Result Value Ref Range   ABO/RH(D) O POS   Protein / creatinine ratio, urine     Status: None   Collection Time: 03/17/15  1:08 PM  Result Value Ref Range   Creatinine, Urine 210.00 mg/dL   Total Protein, Urine 21 mg/dL   Protein Creatinine Ratio 0.10 0.00 - 0.15  Glucose, capillary     Status: Abnormal   Collection Time: 03/17/15  2:05 PM  Result Value Ref Range   Glucose-Capillary 204 (H) 70 - 99 mg/dL   Assessment / Plan: 3863w0d week IUP Labor: IOL Fetal Wellbeing:  Category I Pain Control:  None Anticipated MOD:  SVD Continue Cytotec.   WhitetailVirginia Davis Vannatter, CNM 03/17/2015 2:25 PM

## 2015-03-17 NOTE — Progress Notes (Signed)
Patient allowed to be off EFM for the night, NPO after midnight. CBG check at 12 and 4

## 2015-03-17 NOTE — Progress Notes (Signed)
Patient ID: Dawn Fitzpatrick, female   DOB: 04-Jul-1981, 34 y.o.   MRN: [id number] Patient admitted for IOL secondary to gestational hypertension. Ultrasound performed to confirm fetal presentation before continuing with induction of labor. Fetus noted to in oblique lie, almost breech presentation. Patient very tearful as she was hoping for a vaginal birth. Patient with last food intake at 6 pm. Patient considering ECV. She was counseled on the risks and benefits of the procedure and desires to think about it. Given the non-emergent nature of th indication for cesarean section, will delay delivery until the morning. Will discuss ECV again in the morning to see if the patient is still interested. Or has been notified. Patient may have a small meal but needs to be NPO after midnight in preparation for potential surgery. Fetal status remains reactive

## 2015-03-17 NOTE — Progress Notes (Signed)
Patient ID: Dawn Fitzpatrick, female   DOB: November 16, 1981, 34 y.o.   MRN: [id number] Lengthy discussion about details of birth w/ 10-7 baby. Pt reports shoulder dystocia that appeared to she and her hhusband to have been easily resolved w/ "twisting" the baby. She states baby was vigorous at birth. No complications. Also dicussed increased risk of shoulder dystocia w/ this baby due to suspected fetal macrosomia, AC >97%. Discussed maneuvers to resolve shoulder dystocia, but also explained that some shoulder dystocias cannot be resolved resulting in fetal damage or death. Pt states she does not want a C/S at this time, but will do whatever is necessary for a heavy baby. Will proceed w/ attempt at vaginal birth, but will CTO closely for inadequate labor progress.

## 2015-03-17 NOTE — H&P (Signed)
Dawn Fitzpatrick is a 34 y.o. female presenting for scheduled IOL for gHTN  Maternal Medical History:  Reason for admission: Nausea.   Patient is 89 y.o. G3P2002 1344w0d, GBS positive, h/o previous LGA baby, here for scheduled IOL for gHTN.  Patient has h/o DBP >90 at 4027w6d and 5344w0d; but also had BP of 155/80 at 5967w0d, more recent BP has been wnl. She denies HA, vision changes, has some LE edema which is worse at end of day, resolved with raising feet.  Of note, h/o gDM in previous pregnancy, This pregnancy HgbA1C 6.3, 1hr GTT 120.    Previous pregnancy with h/o 10lb/7oz baby w/shoulder dystocia.  This pregnancy, EFW by 3/14 u/s is 9 lb 2 oz, BPP 8/8, transverse presentation w/head to maternal R, anterior placenta.  More recent visit w/vertex presentation per pt, not noted in records.   +FM, denies LOF, VB, contractions, vaginal discharge.       Clinic [facility]  Dating LMP   Genetic Screen Declined   Anatomic US normal 2 liver calcifications  GTT Early: declined Third trimester: 120 at 3630w5d HgA1C 6.3%  TDaP vaccine 12/28/14  Flu vaccine Declined  GBS positive  Contraception vasectomy vs condoms  Baby Food breast  Circumcision Yes  [employer]s development officerediatrician [facility]  Support Person husband      OB History    Gravida Para Term Preterm AB TAB SAB Ectopic Multiple Living   3 2 2  0 0 0 0 0 0 2     Past Medical History  Diagnosis Date  . Gestational diabetes     Diet controlled/  . PIH (pregnancy induced hypertension)    Past Surgical History  Procedure Laterality Date  . Tonsillectomy and adenoidectomy  1991   Family History: family history includes Diabetes in her paternal grandmother, paternal uncle, paternal uncle, paternal uncle, and paternal uncle; Heart disease in her father, maternal grandfather, and maternal grandmother; Stroke in her maternal grandmother. Social History:  reports that she has been smoking.  She has never used smokeless  tobacco. She reports that she does not drink alcohol or use illicit drugs.   Prenatal Transfer Tool  Maternal Diabetes: No Genetic Screening: Declined Maternal Ultrasounds/Referrals: Abnormal:  Findings:   Other:Liver Calcifications Fetal Ultrasounds or other Referrals:  None Maternal Substance Abuse:  No Significant Maternal Medications:  None Significant Maternal Lab Results:  Lab values include: Group B Strep positive Other Comments:  None  Review of Systems  Constitutional: Negative for fever and chills.  Eyes: Negative for blurred vision.  Respiratory: Negative for cough and shortness of breath.   Cardiovascular: Negative for leg swelling.  Gastrointestinal: Negative for nausea, vomiting, abdominal pain and diarrhea.  Genitourinary: Negative for dysuria and hematuria.  Neurological: Negative for headaches.    Last menstrual period 06/17/2014. Exam Physical Exam  Constitutional: She is oriented to person, place, and time. She appears well-developed and well-nourished. No distress.  HENT:  Head: Normocephalic and atraumatic.  Eyes: Conjunctivae and EOM are normal. Pupils are equal, round, and reactive to light.  Neck: Normal range of motion.  Cardiovascular: Normal rate and intact distal pulses.   Respiratory: Effort normal and breath sounds normal. No respiratory distress.  GI: Soft. She exhibits no distension. There is no tenderness.  Musculoskeletal: Normal range of motion. She exhibits edema.  Mild LE edema  Neurological: She is alert and oriented to person, place, and time. She has normal reflexes. Coordination normal.  Skin: Skin is warm and dry. She is not diaphoretic.  Psychiatric:  She has a normal mood and affect. Her behavior is normal.  Cervical eam 1/thick/-3  Prenatal labs: ABO, Rh: O/Positive/-- (08/19 0000) Antibody: Negative (08/19 0000) Rubella: Immune (08/19 0000) RPR: Nonreactive (01/05 0000)  HBsAg: Negative (08/19 0000)  HIV: Non-reactive (01/05  0000)  GBS: Positive (03/03 0000)   Assessment/Plan: A: Dawn Fitzpatrick is a 34 y.o. female presenting for scheduled IOL for gHTN      U/S confirmed vertex presentation  P: Admit to birthing suites for induction of labor for [facility].       Anticipate active management of labor   # Labor: Cytotec # ZOX:[id number] 1: 140 bpm with mod variability, accels, no decels # ID:GBS positive, Start PCN in active labor, ROM or with pitocin, whichever is first.  # MOF: Breast # Circumcision: yes # MOC: Vasectomy # gHTN: BP appropriate at this time, will check CBC, CMP, Pr/Cr ratio   Dawn Fitzpatrick 03/17/2015, 8:00 AM   I was present for the exam and agree with above.   [place], CNM 03/17/2015 11:47 AM

## 2015-03-18 ENCOUNTER — Inpatient Hospital Stay (HOSPITAL_COMMUNITY): Payer: PRIVATE HEALTH INSURANCE | Admitting: Anesthesiology

## 2015-03-18 ENCOUNTER — Encounter (HOSPITAL_COMMUNITY)
Admission: RE | Disposition: A | Discharge status: Home / Self Care | Payer: Self-pay | Source: Ambulatory Visit | Attending: Family Medicine

## 2015-03-18 ENCOUNTER — Encounter (HOSPITAL_COMMUNITY): Payer: Self-pay

## 2015-03-18 DIAGNOSIS — O329XX0 Maternal care for malpresentation of fetus, unspecified, not applicable or unspecified: Secondary | ICD-10-CM | POA: Insufficient documentation

## 2015-03-18 DIAGNOSIS — Z3A39 39 weeks gestation of pregnancy: Secondary | ICD-10-CM | POA: Insufficient documentation

## 2015-03-18 DIAGNOSIS — O321XX1 Maternal care for breech presentation, fetus 1: Secondary | ICD-10-CM

## 2015-03-18 DIAGNOSIS — O3663X0 Maternal care for excessive fetal growth, third trimester, not applicable or unspecified: Secondary | ICD-10-CM | POA: Insufficient documentation

## 2015-03-18 LAB — GLUCOSE, CAPILLARY
GLUCOSE-CAPILLARY: 101 mg/dL — AB (ref 70–99)
GLUCOSE-CAPILLARY: 153 mg/dL — AB (ref 70–99)
GLUCOSE-CAPILLARY: 91 mg/dL (ref 70–99)
Glucose-Capillary: 106 mg/dL — ABNORMAL HIGH (ref 70–99)
Glucose-Capillary: 82 mg/dL (ref 70–99)

## 2015-03-18 LAB — CBC
HEMATOCRIT: 38.6 % (ref 36.0–46.0)
HEMOGLOBIN: 12.5 g/dL (ref 12.0–15.0)
MCH: 25.1 pg — ABNORMAL LOW (ref 26.0–34.0)
MCHC: 32.4 g/dL (ref 30.0–36.0)
MCV: 77.4 fL — AB (ref 78.0–100.0)
Platelets: 277 10*3/uL (ref 150–400)
RBC: 4.99 MIL/uL (ref 3.87–5.11)
RDW: 16.1 % — ABNORMAL HIGH (ref 11.5–15.5)
WBC: 14.5 10*3/uL — ABNORMAL HIGH (ref 4.0–10.5)

## 2015-03-18 LAB — COMPREHENSIVE METABOLIC PANEL
ALK PHOS: 112 U/L (ref 39–117)
ALT: 23 U/L (ref 0–35)
AST: 21 U/L (ref 0–37)
Albumin: 2.5 g/dL — ABNORMAL LOW (ref 3.5–5.2)
Anion gap: 8 (ref 5–15)
BUN: 8 mg/dL (ref 6–23)
CHLORIDE: 106 mmol/L (ref 96–112)
CO2: 21 mmol/L (ref 19–32)
CREATININE: 0.59 mg/dL (ref 0.50–1.10)
Calcium: 8.2 mg/dL — ABNORMAL LOW (ref 8.4–10.5)
GFR calc non Af Amer: >90 mL/min (ref >90)
Glucose, Bld: 85 mg/dL (ref 70–99)
POTASSIUM: 3.9 mmol/L (ref 3.5–5.1)
Sodium: 135 mmol/L (ref 135–145)
Total Bilirubin: 0.4 mg/dL (ref 0.3–1.2)
Total Protein: 6.2 g/dL (ref 6.0–8.3)

## 2015-03-18 SURGERY — Surgical Case
Anesthesia: Regional

## 2015-03-18 MED ORDER — PENICILLIN G POTASSIUM 5000000 UNITS IJ SOLR
5.0000 10*6.[IU] | Freq: Once | INTRAVENOUS | Status: AC
  Administered 2015-03-18: 5 10*6.[IU] via INTRAVENOUS
  Filled 2015-03-18: qty 5

## 2015-03-18 MED ORDER — ZOLPIDEM TARTRATE 5 MG PO TABS
5.0000 mg | ORAL_TABLET | Freq: Every evening | ORAL | Status: DC | PRN
  Administered 2015-03-18: 5 mg via ORAL
  Filled 2015-03-18: qty 1

## 2015-03-18 MED ORDER — OXYTOCIN 40 UNITS IN LACTATED RINGERS INFUSION - SIMPLE MED
1.0000 m[IU]/min | INTRAVENOUS | Status: DC
  Administered 2015-03-18: 2 m[IU]/min via INTRAVENOUS

## 2015-03-18 MED ORDER — FENTANYL 2.5 MCG/ML BUPIVACAINE 1/10 % EPIDURAL INFUSION (WH - ANES)
INTRAMUSCULAR | Status: DC | PRN
  Administered 2015-03-18: 14 mL/h via EPIDURAL

## 2015-03-18 MED ORDER — FENTANYL CITRATE 0.05 MG/ML IJ SOLN
INTRAMUSCULAR | Status: AC
  Filled 2015-03-18: qty 2

## 2015-03-18 MED ORDER — GLYCOPYRROLATE 0.2 MG/ML IJ SOLN
INTRAMUSCULAR | Status: AC
  Filled 2015-03-18: qty 1

## 2015-03-18 MED ORDER — METOCLOPRAMIDE HCL 5 MG/ML IJ SOLN
INTRAMUSCULAR | Status: AC
  Filled 2015-03-18: qty 2

## 2015-03-18 MED ORDER — LIDOCAINE HCL (PF) 1 % IJ SOLN
INTRAMUSCULAR | Status: DC | PRN
  Administered 2015-03-18 (×2): 4 mL

## 2015-03-18 MED ORDER — TERBUTALINE SULFATE 1 MG/ML IJ SOLN
INTRAMUSCULAR | Status: AC
  Administered 2015-03-18: 1 mg
  Filled 2015-03-18: qty 1

## 2015-03-18 MED ORDER — PENICILLIN G POTASSIUM 5000000 UNITS IJ SOLR
2.5000 10*6.[IU] | INTRAMUSCULAR | Status: DC
  Administered 2015-03-19 (×2): 2.5 10*6.[IU] via INTRAVENOUS
  Filled 2015-03-18 (×5): qty 2.5

## 2015-03-18 MED ORDER — MORPHINE SULFATE 0.5 MG/ML IJ SOLN
INTRAMUSCULAR | Status: AC
  Filled 2015-03-18: qty 10

## 2015-03-18 MED ORDER — TERBUTALINE SULFATE 1 MG/ML IJ SOLN: 0.2500 mg | Freq: Once | INTRAMUSCULAR | Status: AC | PRN

## 2015-03-18 MED ORDER — PHENYLEPHRINE 8 MG IN D5W 100 ML (0.08MG/ML) PREMIX OPTIME
INJECTION | INTRAVENOUS | Status: AC
  Filled 2015-03-18: qty 100

## 2015-03-18 MED ORDER — OXYTOCIN 10 UNIT/ML IJ SOLN
INTRAMUSCULAR | Status: AC
  Filled 2015-03-18: qty 4

## 2015-03-18 MED ORDER — ONDANSETRON HCL 4 MG/2ML IJ SOLN
INTRAMUSCULAR | Status: AC
  Filled 2015-03-18: qty 2

## 2015-03-18 MED ORDER — PHENYLEPHRINE 40 MCG/ML (10ML) SYRINGE FOR IV PUSH (FOR BLOOD PRESSURE SUPPORT)
PREFILLED_SYRINGE | INTRAVENOUS | Status: AC
  Filled 2015-03-18: qty 10

## 2015-03-18 SURGICAL SUPPLY — 34 items
BENZOIN TINCTURE PRP APPL 2/3 (GAUZE/BANDAGES/DRESSINGS) IMPLANT
CATH ROBINSON RED A/P 16FR (CATHETERS) IMPLANT
CLAMP CORD UMBIL (MISCELLANEOUS) IMPLANT
CLOSURE WOUND 1/2 X4 (GAUZE/BANDAGES/DRESSINGS)
CLOTH BEACON ORANGE TIMEOUT ST (SAFETY) IMPLANT
DRAPE SHEET LG 3/4 BI-LAMINATE (DRAPES) IMPLANT
DRSG OPSITE POSTOP 4X10 (GAUZE/BANDAGES/DRESSINGS) IMPLANT
DURAPREP 26ML APPLICATOR (WOUND CARE) IMPLANT
ELECT REM PT RETURN 9FT ADLT (ELECTROSURGICAL)
ELECTRODE REM PT RTRN 9FT ADLT (ELECTROSURGICAL) IMPLANT
EXTRACTOR VACUUM M CUP 4 TUBE (SUCTIONS) IMPLANT
EXTRACTOR VACUUM M CUP 4' TUBE (SUCTIONS)
GLOVE BIOGEL PI IND STRL 7.5 (GLOVE) IMPLANT
GLOVE BIOGEL PI INDICATOR 7.5 (GLOVE)
GLOVE ECLIPSE 7.5 STRL STRAW (GLOVE) IMPLANT
GOWN STRL REUS W/TWL LRG LVL3 (GOWN DISPOSABLE) IMPLANT
KIT ABG SYR 3ML LUER SLIP (SYRINGE) IMPLANT
NEEDLE HYPO 22GX1.5 SAFETY (NEEDLE) IMPLANT
NEEDLE HYPO 25X5/8 SAFETYGLIDE (NEEDLE) IMPLANT
NS IRRIG 1000ML POUR BTL (IV SOLUTION) IMPLANT
PACK C SECTION WH (CUSTOM PROCEDURE TRAY) IMPLANT
PAD OB MATERNITY 4.3X12.25 (PERSONAL CARE ITEMS) IMPLANT
RTRCTR C-SECT PINK 25CM LRG (MISCELLANEOUS) IMPLANT
STRIP CLOSURE SKIN 1/2X4 (GAUZE/BANDAGES/DRESSINGS) IMPLANT
SUT MNCRL 0 VIOLET CTX 36 (SUTURE) IMPLANT
SUT MONOCRYL 0 CTX 36 (SUTURE)
SUT VIC AB 0 CTX 36 (SUTURE)
SUT VIC AB 0 CTX36XBRD ANBCTRL (SUTURE) IMPLANT
SUT VIC AB 2-0 CT1 27 (SUTURE)
SUT VIC AB 2-0 CT1 TAPERPNT 27 (SUTURE) IMPLANT
SUT VIC AB 4-0 KS 27 (SUTURE) IMPLANT
SYR 30ML LL (SYRINGE) IMPLANT
TOWEL OR 17X24 6PK STRL BLUE (TOWEL DISPOSABLE) IMPLANT
TRAY FOLEY CATH 14FR (SET/KITS/TRAYS/PACK) IMPLANT

## 2015-03-18 NOTE — Progress Notes (Signed)
Labor Progress Note  S: doing well, no complaints  O:  BP 144/95 mmHg  Pulse 108  Temp(Src) 97.7 F (36.5 C) (Oral)  Resp 20  Ht 5\' 6"  (1.676 m)  Wt 340 lb (154.223 kg)  BMI 54.90 kg/m2  LMP 06/17/2014 Cat 1, contracting too frequently for cytotec Bedside sono: cephalic 0930 2/50/-3 1320 2/50/-3  A&P: 34 y.o. [id number] 5018w1d here for IOL 2/2 gHTN (declined earlier IOL) s/p ECV - contracting too frequently for cytotec, start pitocin, AROM next check if able  Perry MountACOSTA,Lacey Wallman ROCIO, MD 3:44 PM

## 2015-03-18 NOTE — Progress Notes (Signed)
Patient ID: Dawn Fitzpatrick, female   DOB: 05-31-1981, 34 y.o.   MRN: [id number] After informed verbal consent, Terbutaline 0.25 mg SQ given, ECV was attempted under Ultrasound guidance.  Backward roll achieved after 3 attempts.  Infant now vertex.  Abdominal binder placed.  Re-reviewed shoulder dystocia and risks of injury, and unpredictable nature of it. She believes this baby is not as big as her first baby and not as small as her daughter.  FHR has excellent variability with spontaneous accels before and after the procedure.   Pt. Tolerated the procedure well. VE performed--cervix is 2 cm/50/vertex/anterior/-3 Cytotec placed.

## 2015-03-18 NOTE — Progress Notes (Signed)
Labor Progress Note  S: doing well, no headache/visual changes/RUQ pain/increased swelling  O:  BP 144/78 mmHg  Pulse 90  Temp(Src) 97.8 F (36.6 C) (Oral)  Resp 20  Ht 5\' 6"  (1.676 m)  Wt 340 lb (154.223 kg)  BMI 54.90 kg/m2  LMP 06/17/2014 Cat 1, contracting too frequently for cytotec Bedside sono: cephalic 0930 2/50/-3 1320 2/50/-3 cephalic via sono 1740 3/60/-3, very ballotable 1950 3/60/-2 AROM blood tinged  A&P: 34 y.o. [id number] 4436w1d here for IOL 2/2 gHTN (declined earlier IOL) s/p ECV - on 6mu pit, will increase - BP elevated, no HA/visual changes/RUQ pain => preE labs  Perry MountACOSTA,Josemanuel Eakins ROCIO, MD 7:50 PM

## 2015-03-18 NOTE — Progress Notes (Addendum)
Labor Progress Note  S: slept well, requests ECV  O:  BP 133/70 mmHg  Pulse 84  Temp(Src) 98.2 F (36.8 C) (Oral)  Resp 20  Ht 5\' 6"  (1.676 m)  Wt 340 lb (154.223 kg)  BMI 54.90 kg/m2  LMP 06/17/2014 Cat 1 Bedside sono: breech  A&P: 34 y.o. [id number] 8360w1d here for IOL 2/2 gHTN (declined earlier IOL) breech presetnation - agreeable to ECV, discussed at length including risk of abruption/fetal deceleration/possible need for emergent cesarean section/failure.  Agreeable to risks    Perry MountACOSTA,Myer Bohlman ROCIO, MD 8:51 AM

## 2015-03-18 NOTE — Anesthesia Preprocedure Evaluation (Signed)
Anesthesia Evaluation  Patient identified by MRN, date of birth, ID band Patient awake    Reviewed: Allergy & Precautions, Patient's Chart, lab work & pertinent test results  Airway Mallampati: III  TM Distance: >3 FB Neck ROM: Full    Dental no notable dental hx. (+) Teeth Intact   Pulmonary Current Smoker,  breath sounds clear to auscultation  Pulmonary exam normal       Cardiovascular hypertension, Rhythm:Regular Rate:Normal     Neuro/Psych negative neurological ROS  negative psych ROS   GI/Hepatic Neg liver ROS, GERD-  ,  Endo/Other  diabetes, Well Controlled, GestationalMorbid obesity  Renal/GU negative Renal ROS  negative genitourinary   Musculoskeletal negative musculoskeletal ROS (+)   Abdominal (+) + obese,   Peds  Hematology negative hematology ROS (+)   Anesthesia Other Findings   Reproductive/Obstetrics (+) Pregnancy                             Anesthesia Physical Anesthesia Plan  ASA: III  Anesthesia Plan: Epidural   Post-op Pain Management:    Induction:   Airway Management Planned: Natural Airway  Additional Equipment:   Intra-op Plan:   Post-operative Plan:   Informed Consent: I have reviewed the patients History and Physical, chart, labs and discussed the procedure including the risks, benefits and alternatives for the proposed anesthesia with the patient or authorized representative who has indicated his/her understanding and acceptance.     Plan Discussed with: Anesthesiologist  Anesthesia Plan Comments:         Anesthesia Quick Evaluation

## 2015-03-18 NOTE — Progress Notes (Signed)
Labor Progress Note  S: doing well, feeling some cramping  O:  BP 122/59 mmHg  Pulse 81  Temp(Src) 97.8 F (36.6 C) (Oral)  Resp 20  Ht 5\' 6"  (1.676 m)  Wt 340 lb (154.223 kg)  BMI 54.90 kg/m2  LMP 06/17/2014 Cat 1, contracting too frequently for cytotec Bedside sono: cephalic 0930 2/50/-3 1320 2/50/-3 cephalic via sono 1740 3/60/-3, very ballotable  A&P: 34 y.o. [id number] 1654w1d here for IOL 2/2 gHTN (declined earlier IOL) s/p ECV - on 6mu pit, unable to increase 2/2 contracting q512min.  AROM next check if head more applied, unable to do this exam.  Perry MountACOSTA,Seraya Jobst ROCIO, MD 5:41 PM

## 2015-03-18 NOTE — Anesthesia Procedure Notes (Signed)
Epidural Patient location during procedure: OB Start time: 03/18/2015 11:20 PM  Staffing Anesthesiologist: Mal AmabileFOSTER, Dayla Gasca Performed by: anesthesiologist   Preanesthetic Checklist Completed: patient identified, site marked, surgical consent, pre-op evaluation, timeout performed, IV checked, risks and benefits discussed and monitors and equipment checked  Epidural Patient position: sitting Prep: site prepped and draped and DuraPrep Patient monitoring: continuous pulse ox and blood pressure Approach: midline Location: L3-L4 Injection technique: LOR air  Needle:  Needle type: Tuohy  Needle gauge: 17 G Needle length: 9 cm and 9 Needle insertion depth: 8 cm Catheter type: closed end flexible Catheter size: 19 Gauge Catheter at skin depth: 14 cm Test dose: negative and Other  Assessment Events: blood not aspirated, injection not painful, no injection resistance, negative IV test and no paresthesia  Additional Notes Patient identified. Risks and benefits discussed including failed block, incomplete  Pain control, post dural puncture headache, nerve damage, paralysis, blood pressure Changes, nausea, vomiting, reactions to medications-both toxic and allergic and post Partum back pain. All questions were answered. Patient expressed understanding and wished to proceed. Sterile technique was used throughout procedure. Epidural site was Dressed with sterile barrier dressing. No paresthesias, signs of intravascular injection Or signs of intrathecal spread were encountered.  Patient was more comfortable after the epidural was dosed. Please see RN's note for documentation of vital signs and FHR which are stable.

## 2015-03-19 ENCOUNTER — Encounter (HOSPITAL_COMMUNITY): Payer: Self-pay

## 2015-03-19 DIAGNOSIS — O133 Gestational [pregnancy-induced] hypertension without significant proteinuria, third trimester: Secondary | ICD-10-CM

## 2015-03-19 DIAGNOSIS — Z3A39 39 weeks gestation of pregnancy: Secondary | ICD-10-CM

## 2015-03-19 DIAGNOSIS — O99820 Streptococcus B carrier state complicating pregnancy: Secondary | ICD-10-CM

## 2015-03-19 DIAGNOSIS — O26843 Uterine size-date discrepancy, third trimester: Secondary | ICD-10-CM

## 2015-03-19 LAB — GLUCOSE, CAPILLARY
Glucose-Capillary: 111 mg/dL — ABNORMAL HIGH (ref 70–99)
Glucose-Capillary: 119 mg/dL — ABNORMAL HIGH (ref 70–99)
Glucose-Capillary: 93 mg/dL (ref 70–99)

## 2015-03-19 LAB — PROTEIN / CREATININE RATIO, URINE
CREATININE, URINE: 331 mg/dL
Protein Creatinine Ratio: 0.25 — ABNORMAL HIGH (ref 0.00–0.15)
Total Protein, Urine: 84 mg/dL

## 2015-03-19 MED ORDER — DIPHENHYDRAMINE HCL 25 MG PO CAPS: 25.0000 mg | ORAL_CAPSULE | Freq: Four times a day (QID) | ORAL | Status: DC | PRN

## 2015-03-19 MED ORDER — TETANUS-DIPHTH-ACELL PERTUSSIS 5-2.5-18.5 LF-MCG/0.5 IM SUSP: 0.5000 mL | Freq: Once | INTRAMUSCULAR | Status: DC

## 2015-03-19 MED ORDER — OXYCODONE-ACETAMINOPHEN 5-325 MG PO TABS: 1.0000 | ORAL_TABLET | ORAL | Status: DC | PRN

## 2015-03-19 MED ORDER — SENNOSIDES-DOCUSATE SODIUM 8.6-50 MG PO TABS
2.0000 | ORAL_TABLET | ORAL | Status: DC
  Administered 2015-03-19: 2 via ORAL
  Filled 2015-03-19: qty 2

## 2015-03-19 MED ORDER — PRENATAL MULTIVITAMIN CH
1.0000 | ORAL_TABLET | Freq: Every day | ORAL | Status: DC
  Administered 2015-03-19 – 2015-03-20 (×2): 1 via ORAL
  Filled 2015-03-19 (×2): qty 1

## 2015-03-19 MED ORDER — WITCH HAZEL-GLYCERIN EX PADS
1.0000 | MEDICATED_PAD | CUTANEOUS | Status: DC | PRN
Start: 2015-03-19 — End: 2015-03-20

## 2015-03-19 MED ORDER — BENZOCAINE-MENTHOL 20-0.5 % EX AERO: 1.0000 "application " | INHALATION_SPRAY | CUTANEOUS | Status: DC | PRN

## 2015-03-19 MED ORDER — ACETAMINOPHEN 325 MG PO TABS: 650.0000 mg | ORAL_TABLET | ORAL | Status: DC | PRN

## 2015-03-19 MED ORDER — LANOLIN HYDROUS EX OINT: TOPICAL_OINTMENT | CUTANEOUS | Status: DC | PRN

## 2015-03-19 MED ORDER — ONDANSETRON HCL 4 MG/2ML IJ SOLN: 4.0000 mg | INTRAMUSCULAR | Status: DC | PRN

## 2015-03-19 MED ORDER — DIBUCAINE 1 % RE OINT: 1.0000 "application " | TOPICAL_OINTMENT | RECTAL | Status: DC | PRN

## 2015-03-19 MED ORDER — ONDANSETRON HCL 4 MG PO TABS: 4.0000 mg | ORAL_TABLET | ORAL | Status: DC | PRN

## 2015-03-19 MED ORDER — IBUPROFEN 600 MG PO TABS
600.0000 mg | ORAL_TABLET | Freq: Four times a day (QID) | ORAL | Status: DC
  Administered 2015-03-19 – 2015-03-20 (×5): 600 mg via ORAL
  Filled 2015-03-19 (×6): qty 1

## 2015-03-19 MED ORDER — SIMETHICONE 80 MG PO CHEW: 80.0000 mg | CHEWABLE_TABLET | ORAL | Status: DC | PRN

## 2015-03-19 MED ORDER — OXYCODONE-ACETAMINOPHEN 5-325 MG PO TABS: 2.0000 | ORAL_TABLET | ORAL | Status: DC | PRN

## 2015-03-19 MED ORDER — ZOLPIDEM TARTRATE 5 MG PO TABS: 5.0000 mg | ORAL_TABLET | Freq: Every evening | ORAL | Status: DC | PRN

## 2015-03-19 NOTE — Anesthesia Postprocedure Evaluation (Signed)
  Anesthesia Post-op Note  Patient: Dawn Fitzpatrick  Procedure(s) Performed: * No procedures listed *  Patient Location: PACU and Mother/Baby  Anesthesia Type:Epidural  Level of Consciousness: awake, alert  and oriented  Airway and Oxygen Therapy: Patient Spontanous Breathing  Post-op Pain: mild  Post-op Assessment: Post-op Vital signs reviewed, Patient's Cardiovascular Status Stable, Respiratory Function Stable, No signs of Nausea or vomiting, Adequate PO intake, Pain level controlled, No headache, No backache, No residual numbness and No residual motor weakness  Post-op Vital Signs: Reviewed and stable  Last Vitals:  Filed Vitals:   03/19/15 0802  BP: 114/66  Pulse: 66  Temp: 36.7 C  Resp: 20    Complications: No apparent anesthesia complications

## 2015-03-19 NOTE — Lactation Note (Signed)
This note was copied from the chart of Dawn Idalia Needleara Quillin. Lactation Consultation Note  Patient Name: Dawn Fitzpatrick LKGMW'NToday's Date: 03/19/2015 Reason for consult: Initial assessment of this mom and baby at 18 hours pp. Mom has 2 older children, ages 642 and 284 yo and mom breastfed them each for 15 months, as well as pumping when return to work.  Mom notes that her newborn has been cluster feeding and has a receding chin and is reluctant to open wide but she corrects the latch as needed.  LC encouraged frequent STS and cue feedings and discussed chin tug technique to assist baby to open wider for latch.  Mom says she has been expressing her colostrum to entice an open mouth.  Initial LATCH was "7" per RN assessment but [facility] "[facility]" and baby has had multiple feedings, with more than minimum output of voids and stools. Mom encouraged to feed baby 8-12 times/24 hours and with feeding cues. LC encouraged review of Baby and Me pp 9, 14 and 20-25 for STS and BF information. LC provided [employer] Resource brochure and reviewed [facility] services and list of community and web site resources.     Maternal Data Formula Feeding for Exclusion: No Has patient been taught Hand Expression?: Yes (experienced breastfeeding mother who states she knows how to hand express) Does the patient have breastfeeding experience prior to this delivery?: Yes  Feeding Feeding Type: Breast Fed  LATCH Score/Interventions           LATCH scores of 7, then several "9" scores           Lactation Tools Discussed/Used   STS, cue feedings, hand expression Techniques for helping baby open mouth wide, including ebm and chin tug  Consult Status Consult Status: Follow-up Date: 03/20/15 Follow-up type: In-patient    Warrick ParisianBryant, Reese Stockman [facility] 03/19/2015, 10:39 PM

## 2015-03-19 NOTE — Progress Notes (Signed)
Idalia Needleara Homen is a 34 y.o. G3P2002 at 6866w2d   Subjective: Comfortable with epidural; BPs have decreased since receiving epidural  Objective: BP 133/70 mmHg  Pulse 83  Temp(Src) 97.8 F (36.6 C) (Oral)  Resp 18  Ht 5\' 6"  (1.676 m)  Wt 154.223 kg (340 lb)  BMI 54.90 kg/m2  SpO2 95%  LMP 06/17/2014    nl PIH labs  FHT:  FHR: 130s bpm, variability: moderate,  accelerations:  Present,  decelerations:  Absent occ mi variables UC:   regular, every 3 minutes with Pit @ 1610mu/min SVE:   Dilation: 6 Effacement (%): 100 Station: -2, -1 Exam by:: Gardiner Coins McIntosh RN- exam deferred  Labs: Lab Results  Component Value Date   WBC 14.5* 03/18/2015   HGB 12.5 03/18/2015   HCT 38.6 03/18/2015   MCV 77.4* 03/18/2015   PLT 277 03/18/2015    Assessment / Plan: IUP@ term gHTN- stable Impaired glucose tol- nl CBGs Early active labor  Continue with cx exams q 2 hrs Anticipate SVD  SHAW, KIMBERLY CNM 03/19/2015, 2:20 AM

## 2015-03-20 MED ORDER — IBUPROFEN 600 MG PO TABS: 600.0000 mg | ORAL_TABLET | Freq: Four times a day (QID) | ORAL | Status: AC

## 2015-03-20 MED ORDER — OXYCODONE-ACETAMINOPHEN 5-325 MG PO TABS: 2.0000 | ORAL_TABLET | ORAL | Status: DC | PRN

## 2015-03-20 NOTE — Lactation Note (Signed)
This note was copied from the chart of Dawn Fitzpatrick Viall. Lactation Consultation Note  P3, Ex BF. Mother denies problems or soreness. First chid at a frenotomy.  Denies problems w/ this baby.  LS9. Reviewed engorgement care, monitoring voids/stools. Mom encouraged to feed baby 8-12 times/24 hours and with feeding cues.  Suggest she call if she needs assistance.  Patient Name: Dawn Fitzpatrick Melle AVWUJ'WToday's Date: 03/20/2015 Reason for consult: Follow-up assessment   Maternal Data    Feeding Feeding Type: Breast Fed Length of feed: 20 min  LATCH Score/Interventions Latch: Grasps breast easily, tongue down, lips flanged, rhythmical sucking.  Audible Swallowing: Spontaneous and intermittent  Type of Nipple: Everted at rest and after stimulation  Comfort (Breast/Nipple): Soft / non-tender     Hold (Positioning): No assistance needed to correctly position infant at breast.  LATCH Score: 10  Lactation Tools Discussed/Used     Consult Status Consult Status: Complete    Hardie PulleyBerkelhammer, Ruth Boschen 03/20/2015, 10:38 AM

## 2015-03-20 NOTE — Discharge Summary (Signed)
Obstetric Discharge Summary Reason for Admission: induction of labor Prenatal Procedures: ultrasound Intrapartum Procedures: spontaneous vaginal delivery Postpartum Procedures: none Complications-Operative and Postpartum: none HEMOGLOBIN  Date Value Ref Range Status  03/18/2015 12.5 12.0 - 15.0 g/dL Final   HCT  Date Value Ref Range Status  03/18/2015 38.6 36.0 - 46.0 % Final    Physical Exam:  General: alert, cooperative, appears stated age and no distress Lochia: appropriate Uterine Fundus: firm Incision: healing well DVT Evaluation: No evidence of DVT seen on physical exam. Negative Homan's sign. No cords or calf tenderness.  Discharge Diagnoses: Term Pregnancy-delivered and gestational hypertension and GDM  Discharge Information: Date: 03/20/2015 Activity: pelvic rest Diet: routine Medications: PNV, Ibuprofen and Percocet Condition: stable and improved Instructions: refer to practice specific booklet Discharge to: home   Newborn Data: Live born female  Birth Weight: 9 lb 12.3 oz (4430 g) APGAR: 6, 9  Home with mother.  Wyvonnia DuskyLAWSON, MARIE DARLENE 03/20/2015, 9:44 AM

## 2015-05-09 ENCOUNTER — Ambulatory Visit (INDEPENDENT_AMBULATORY_CARE_PROVIDER_SITE_OTHER): Payer: PRIVATE HEALTH INSURANCE | Admitting: Obstetrics & Gynecology

## 2015-05-09 NOTE — Progress Notes (Signed)
  Subjective:     Dawn Fitzpatrick is a 34 y.o. MW P3  female who presents for a postpartum visit. She is 7 weeks postpartum following a spontaneous vaginal delivery. I have fully reviewed the prenatal and intrapartum course. The delivery was at 39 gestational weeks. Outcome: spontaneous vaginal delivery. Anesthesia: epidural. Postpartum course has been normal. Baby's course has been normal. Baby is feeding by breast. Bleeding no bleeding. Bowel function is normal. Bladder function is normal. Patient is not sexually active. Contraception method is condoms. Postpartum depression screening: negative, however she has a very large amount of stress right now. Her stepson's mother died when Dawn Fitzpatrick was 2 weeks pp so now she has a 33 yo stepson living with her also. There is a lot of family drama at this point. She denies HI and SI.  The following portions of the patient's history were reviewed and updated as appropriate: allergies, current medications, past family history, past medical history, past social history, past surgical history and problem list.  Review of Systems Pertinent items are noted in HPI.   Objective:    BP 148/87 mmHg  Pulse 81  Ht 5\' 6"  (1.676 m)  Wt 318 lb (144.244 kg)  BMI 51.35 kg/m2  Breastfeeding? Yes  General:  alert   Breasts:  inspection negative, no nipple discharge or bleeding, no masses or nodularity palpable  Lungs: clear to auscultation bilaterally  Heart:  regular rate and rhythm, S1, S2 normal, no murmur, click, rub or gallop  Abdomen: soft, non-tender; bowel sounds normal; no masses,  no organomegaly   Vulva:  not evaluated  Vagina: not evaluated  Cervix:  not evaluated  Corpus: not examined  Adnexa:  not evaluated  Rectal Exam: Not performed.        Assessment:     Normal postpartum exam. Pap smear not done at today's visit.   Plan:    1. Contraception: condoms 2. She tells me that she had a normal pap at her gyn office in [place] and she will get another  this fall 3. Follow up in: prn

## 2015-06-10 ENCOUNTER — Other Ambulatory Visit: Payer: Self-pay | Admitting: Obstetrics and Gynecology

## 2015-06-10 ENCOUNTER — Telehealth: Payer: Self-pay | Admitting: *Deleted

## 2015-06-10 MED ORDER — BREAST PUMP MISC: Status: AC

## 2015-06-10 NOTE — Telephone Encounter (Signed)
Breast pump order

## 2016-03-29 IMAGING — US US OB FOLLOW-UP
1 series · 12 of 28 positions shown · non-contrast
Comparison: none

[Series 1: us ob follow up · 12 of 29 slices shown]
[im 2/29]
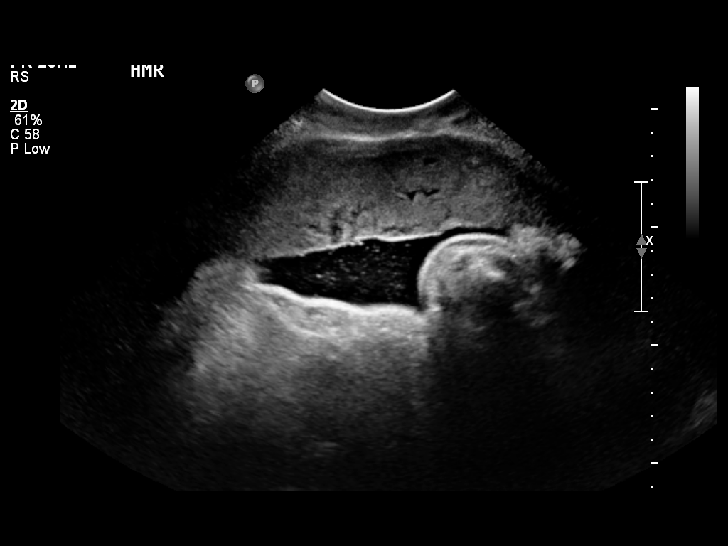
[im 4/29]
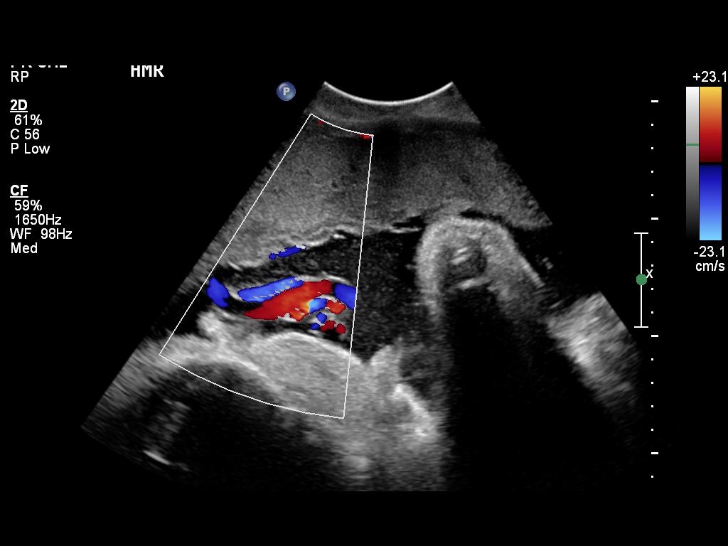
[im 6/29]
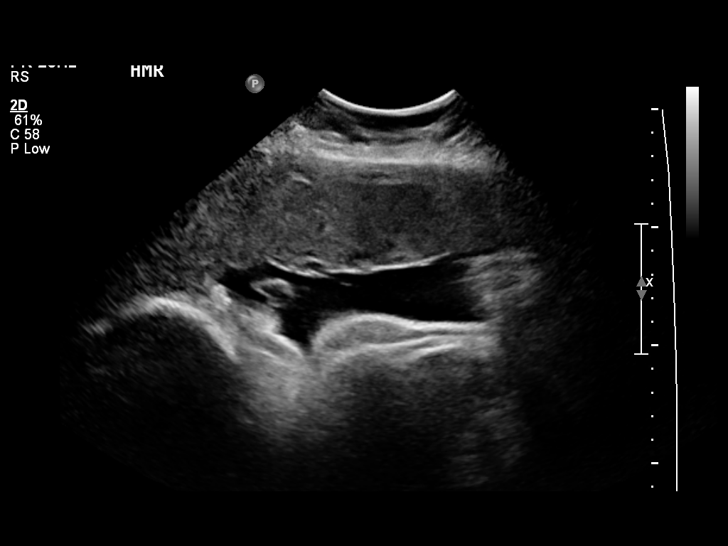
[im 9/29]
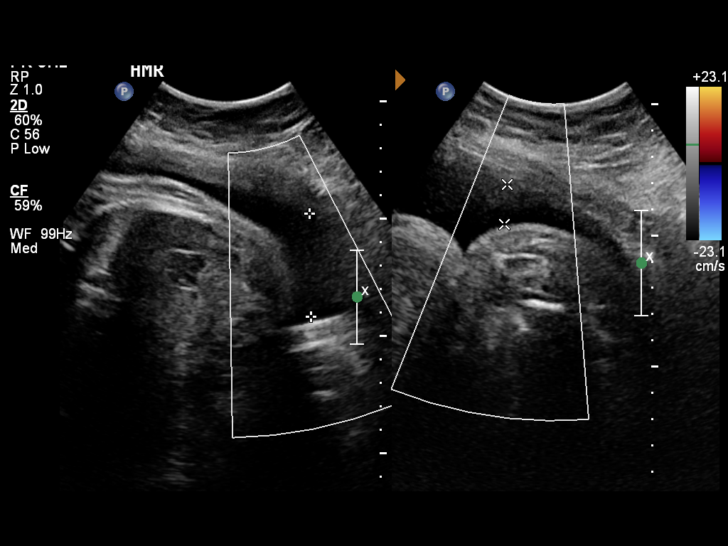
[im 11/29]
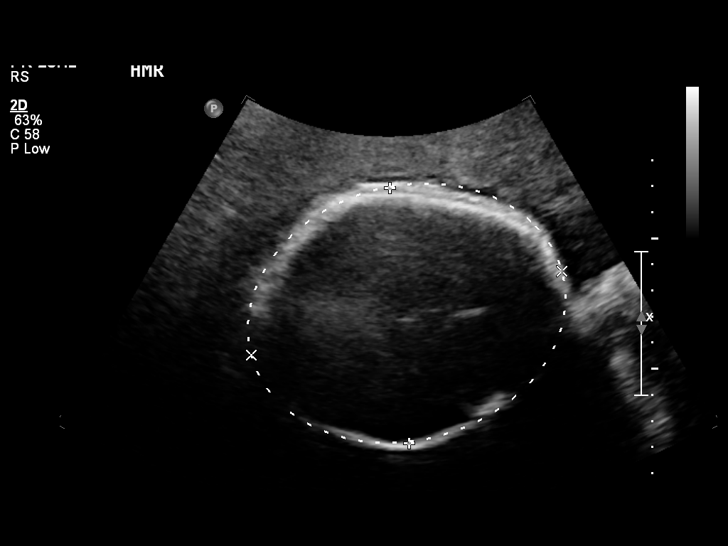
[im 13/29]
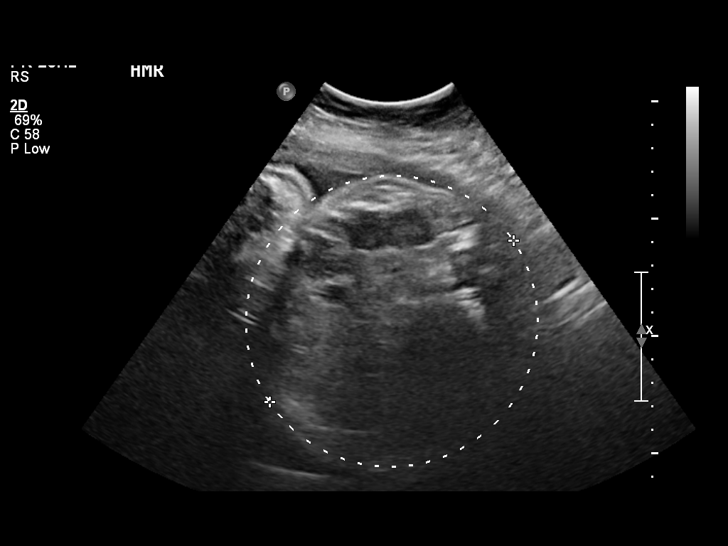
[im 16/29]
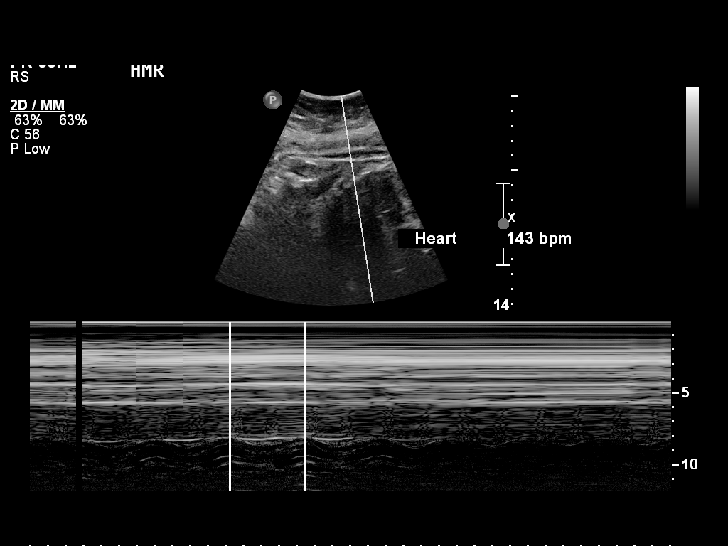
[im 18/29]
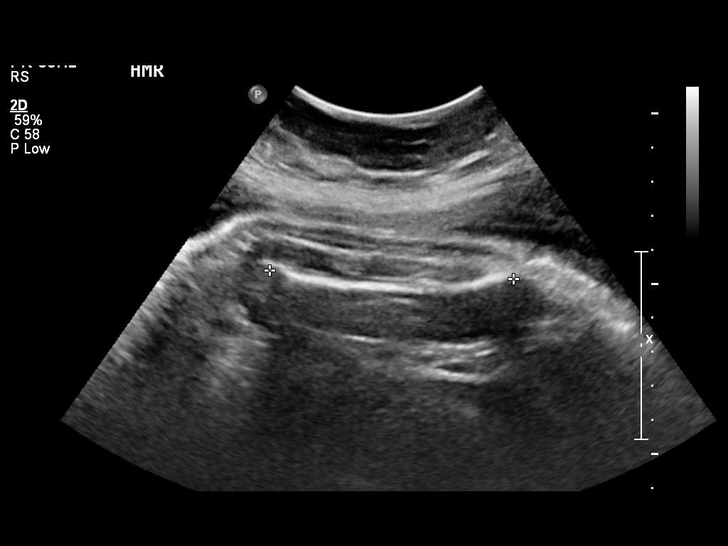
[im 20/29]
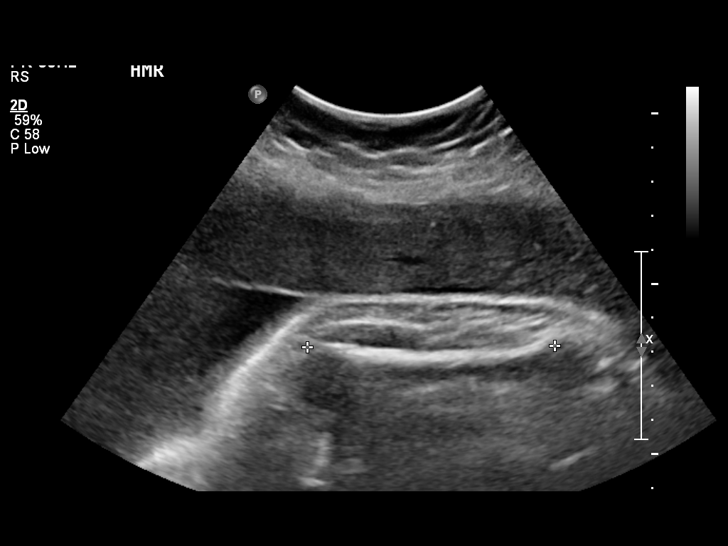
[im 23/29]
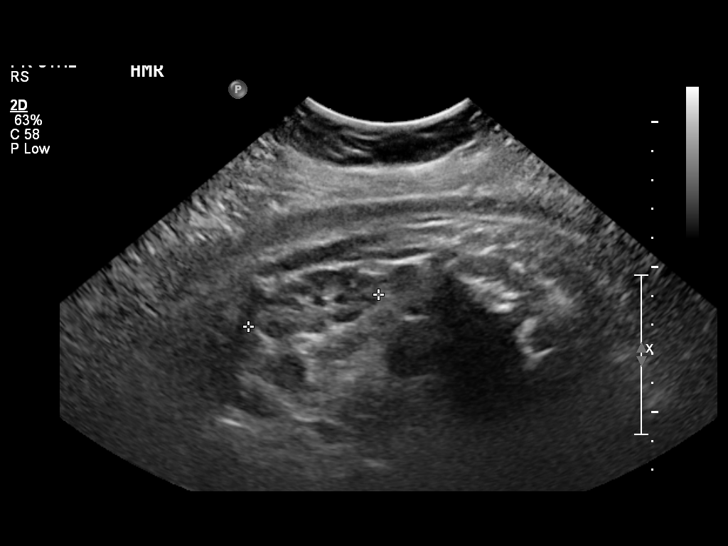
[im 25/29]
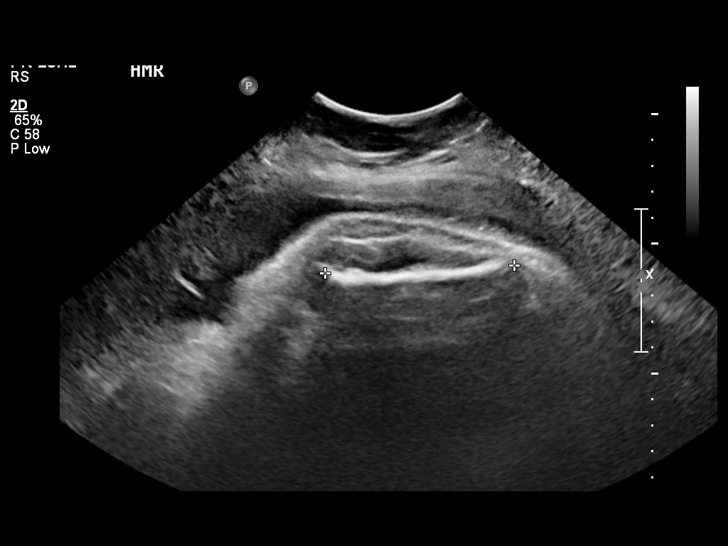
[im 27/29]
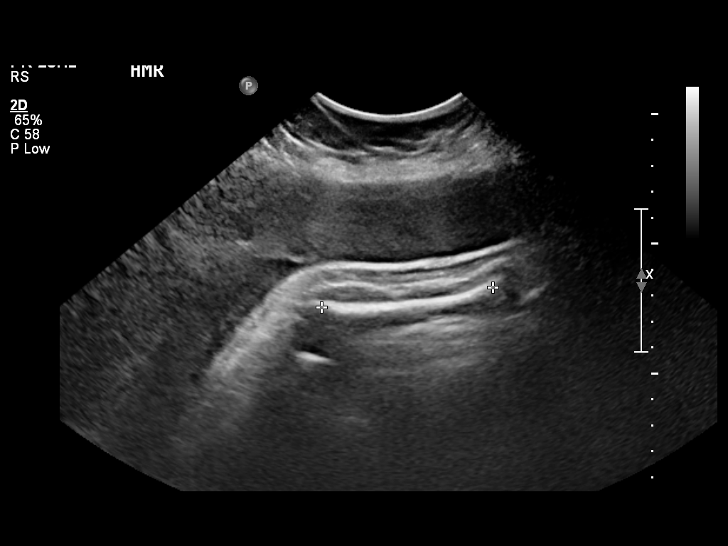

[12 of 28 positions shown; findings below may reference images not displayed]

OBSTETRICS REPORT
                      (Signed Final 03/18/2015 [DATE])

Service(s) Provided

 US OB FOLLOW UP                                       76816.1
Indications

 Poor obstetric history: Previous preeclampsia /
 eclampsia/gestational HTN
 Maternal morbid obesity  BMI
 Poor obstetric history: Previous macrosomia  10lb
 7oz
 39 weeks gestation of pregnancy
 Size greater than dates (Large for gestational age)
Fetal Evaluation

 Num Of Fetuses:    1
 Fetal Heart Rate:  143                          bpm
 Cardiac Activity:  Observed
 Presentation:      Transverse, head to
                    maternal right
 Placenta:          Anterior, above cervical os
 P. Cord            Visualized
 Insertion:

 Amniotic Fluid
 AFI FV:      Subjectively within normal limits
 AFI Sum:     14.26   cm       57  %Tile     Larg Pckt:    4.59  cm
 RUQ:   4.59    cm   RLQ:    3.78   cm    LUQ:   4.38    cm   LLQ:    1.51   cm
Biometry

 BPD:     97.8  mm     G. Age:  40w 1d                CI:        78.36   70 - 86
                                                      FL/HC:      20.5   20.6 -

 HC:     349.5  mm     G. Age:  40w 5d       76  %    HC/AC:      0.89   0.87 -

 AC:     393.9  mm     G. Age:  N/A        > 97  %    FL/BPD:     73.2   71 - 87
 FL:      71.6  mm     G. Age:  36w 5d        9  %    FL/AC:      18.2   20 - 24
 HUM:     67.5  mm     G. Age:  39w 2d       78  %

 Est. FW:    2244  gm    9 lb 12 oz    > 90  %
Gestational Age

 LMP:           39w 0d        Date:  06/17/14                 EDD:   03/24/15
 U/S Today:     39w 1d                                        EDD:   03/23/15
 Best:          39w 0d     Det. By:  LMP  (06/17/14)          EDD:   03/24/15
Anatomy

 Cranium:          Previously seen        Ductal Arch:      Previously seen
 Fetal Cavum:      Previously seen        Diaphragm:        Previously seen
 Ventricles:       Previously seen        Stomach:          Appears normal, left
                                                            sided
 Choroid Plexus:   Previously seen        Abdomen:          Appears normal
 Cerebellum:       Previously seen        Abdominal Wall:   Previously seen
 Posterior Fossa:  Previously seen        Cord Vessels:     Previously seen
 Face:             Orbits and profile     Kidneys:          Appear normal
                   previously seen
 Lips:             Previously seen        Bladder:          Appears normal
 Heart:            Previously seen        Spine:            Previously seen
 RVOT:             Previously seen        Lower             Previously seen
                                          Extremities:
 LVOT:             Previously seen        Upper             Previously seen
                                          Extremities:
 Aortic Arch:      Previously seen

 Other:  Technically difficult due to maternal habitus and fetal position.
         Previously seen nasal bone and male gender.
Impression

 Single IUP at 39w 0d
 Limited views of the fetal anatomy obtained due to late
 gestaitonal age and maternal habitus
 The estimated fetal weight is > 90th %tile (2244 g); AC > 90th
 %tile
 Transverse lie with fetal head to maternal right
 Normal amniotic fluid volume
Recommendations

 At risk for LGA infant / shoulder dystocia. If ECV and vaginal
 delivery attempted, would follow labor curve closely and
 would avoid operative vaginal delivery.
 Follow-up ultrasounds as clinically indicated.

 questions or concerns.

## 2025-01-01 ENCOUNTER — Other Ambulatory Visit: Payer: Self-pay | Admitting: Medical Genetics

## 2025-01-04 ENCOUNTER — Other Ambulatory Visit: Payer: Self-pay
